# Patient Record
Sex: Female | Born: 1945 | Race: White | Hispanic: No | Smoking: Current every day smoker
Health system: Southern US, Community
[De-identification: ages and names within clinical notes are randomized; demographics above are authoritative.]

## PROBLEM LIST (undated history)

## (undated) DIAGNOSIS — F419 Anxiety disorder, unspecified: Secondary | ICD-10-CM

## (undated) DIAGNOSIS — C801 Malignant (primary) neoplasm, unspecified: Secondary | ICD-10-CM

## (undated) DIAGNOSIS — I2510 Atherosclerotic heart disease of native coronary artery without angina pectoris: Secondary | ICD-10-CM

## (undated) DIAGNOSIS — E7800 Pure hypercholesterolemia, unspecified: Secondary | ICD-10-CM

## (undated) DIAGNOSIS — R112 Nausea with vomiting, unspecified: Secondary | ICD-10-CM

## (undated) DIAGNOSIS — R51 Headache: Secondary | ICD-10-CM

## (undated) DIAGNOSIS — Z9889 Other specified postprocedural states: Secondary | ICD-10-CM

## (undated) DIAGNOSIS — M1990 Unspecified osteoarthritis, unspecified site: Secondary | ICD-10-CM

## (undated) DIAGNOSIS — R519 Headache, unspecified: Secondary | ICD-10-CM

## (undated) DIAGNOSIS — N2889 Other specified disorders of kidney and ureter: Secondary | ICD-10-CM

## (undated) DIAGNOSIS — R0600 Dyspnea, unspecified: Secondary | ICD-10-CM

## (undated) DIAGNOSIS — I10 Essential (primary) hypertension: Secondary | ICD-10-CM

## (undated) DIAGNOSIS — E119 Type 2 diabetes mellitus without complications: Secondary | ICD-10-CM

## (undated) DIAGNOSIS — I219 Acute myocardial infarction, unspecified: Secondary | ICD-10-CM

## (undated) HISTORY — PX: RENAL CRYOABLATION: SHX2322

## (undated) HISTORY — PX: EYE SURGERY: SHX253

## (undated) HISTORY — PX: CHOLECYSTECTOMY: SHX55

## (undated) HISTORY — PX: CARPAL TUNNEL RELEASE: SHX101

## (undated) HISTORY — PX: APPENDECTOMY: SHX54

## (undated) HISTORY — PX: CARDIAC CATHETERIZATION: SHX172

---

## 2015-09-04 DIAGNOSIS — R9431 Abnormal electrocardiogram [ECG] [EKG]: Secondary | ICD-10-CM | POA: Insufficient documentation

## 2015-09-25 DIAGNOSIS — E782 Mixed hyperlipidemia: Secondary | ICD-10-CM | POA: Insufficient documentation

## 2015-09-25 DIAGNOSIS — G5600 Carpal tunnel syndrome, unspecified upper limb: Secondary | ICD-10-CM | POA: Insufficient documentation

## 2015-09-25 DIAGNOSIS — I10 Essential (primary) hypertension: Secondary | ICD-10-CM | POA: Insufficient documentation

## 2015-09-25 DIAGNOSIS — G43909 Migraine, unspecified, not intractable, without status migrainosus: Secondary | ICD-10-CM | POA: Insufficient documentation

## 2015-11-25 DIAGNOSIS — M5136 Other intervertebral disc degeneration, lumbar region: Secondary | ICD-10-CM | POA: Insufficient documentation

## 2016-01-21 DIAGNOSIS — E785 Hyperlipidemia, unspecified: Secondary | ICD-10-CM | POA: Insufficient documentation

## 2016-01-21 DIAGNOSIS — J189 Pneumonia, unspecified organism: Secondary | ICD-10-CM | POA: Insufficient documentation

## 2016-01-21 DIAGNOSIS — E119 Type 2 diabetes mellitus without complications: Secondary | ICD-10-CM | POA: Insufficient documentation

## 2016-01-21 DIAGNOSIS — Z720 Tobacco use: Secondary | ICD-10-CM | POA: Insufficient documentation

## 2016-01-27 DIAGNOSIS — I5020 Unspecified systolic (congestive) heart failure: Secondary | ICD-10-CM | POA: Insufficient documentation

## 2016-01-27 DIAGNOSIS — F32A Depression, unspecified: Secondary | ICD-10-CM | POA: Insufficient documentation

## 2016-01-27 DIAGNOSIS — D649 Anemia, unspecified: Secondary | ICD-10-CM | POA: Insufficient documentation

## 2016-04-23 ENCOUNTER — Other Ambulatory Visit: Payer: Self-pay | Admitting: Adult Health Nurse Practitioner

## 2016-04-23 DIAGNOSIS — R4189 Other symptoms and signs involving cognitive functions and awareness: Secondary | ICD-10-CM

## 2016-04-23 DIAGNOSIS — R251 Tremor, unspecified: Secondary | ICD-10-CM

## 2016-04-23 DIAGNOSIS — G43809 Other migraine, not intractable, without status migrainosus: Secondary | ICD-10-CM

## 2016-05-05 ENCOUNTER — Ambulatory Visit
Admission: RE | Admit: 2016-05-05 | Discharge: 2016-05-05 | Disposition: A | Discharge status: Home / Self Care | Payer: Medicare Other | Source: Ambulatory Visit | Attending: Adult Health Nurse Practitioner | Admitting: Adult Health Nurse Practitioner

## 2016-05-05 DIAGNOSIS — R251 Tremor, unspecified: Secondary | ICD-10-CM

## 2016-05-05 DIAGNOSIS — G43809 Other migraine, not intractable, without status migrainosus: Secondary | ICD-10-CM

## 2016-05-05 DIAGNOSIS — R4189 Other symptoms and signs involving cognitive functions and awareness: Secondary | ICD-10-CM

## 2016-05-05 MED ORDER — GADOBENATE DIMEGLUMINE 529 MG/ML IV SOLN
13.0000 mL | Freq: Once | INTRAVENOUS | Status: AC | PRN
  Administered 2016-05-05: 13 mL via INTRAVENOUS

## 2016-05-09 HISTORY — PX: CORONARY ARTERY BYPASS GRAFT: SHX141

## 2016-05-14 DIAGNOSIS — I161 Hypertensive emergency: Secondary | ICD-10-CM | POA: Diagnosis not present

## 2016-05-14 DIAGNOSIS — I214 Non-ST elevation (NSTEMI) myocardial infarction: Secondary | ICD-10-CM | POA: Diagnosis not present

## 2016-05-14 DIAGNOSIS — N179 Acute kidney failure, unspecified: Secondary | ICD-10-CM

## 2016-05-14 DIAGNOSIS — E785 Hyperlipidemia, unspecified: Secondary | ICD-10-CM

## 2016-05-14 DIAGNOSIS — R112 Nausea with vomiting, unspecified: Secondary | ICD-10-CM

## 2016-05-14 DIAGNOSIS — F17200 Nicotine dependence, unspecified, uncomplicated: Secondary | ICD-10-CM

## 2016-05-14 DIAGNOSIS — F418 Other specified anxiety disorders: Secondary | ICD-10-CM

## 2016-05-14 DIAGNOSIS — E119 Type 2 diabetes mellitus without complications: Secondary | ICD-10-CM

## 2016-05-14 DIAGNOSIS — I2510 Atherosclerotic heart disease of native coronary artery without angina pectoris: Secondary | ICD-10-CM

## 2016-05-15 DIAGNOSIS — N179 Acute kidney failure, unspecified: Secondary | ICD-10-CM | POA: Diagnosis not present

## 2016-05-15 DIAGNOSIS — I161 Hypertensive emergency: Secondary | ICD-10-CM | POA: Diagnosis not present

## 2016-05-15 DIAGNOSIS — R112 Nausea with vomiting, unspecified: Secondary | ICD-10-CM | POA: Diagnosis not present

## 2016-05-15 DIAGNOSIS — I2510 Atherosclerotic heart disease of native coronary artery without angina pectoris: Secondary | ICD-10-CM | POA: Diagnosis not present

## 2016-05-16 DIAGNOSIS — R112 Nausea with vomiting, unspecified: Secondary | ICD-10-CM | POA: Diagnosis not present

## 2016-05-16 DIAGNOSIS — N179 Acute kidney failure, unspecified: Secondary | ICD-10-CM | POA: Diagnosis not present

## 2016-05-16 DIAGNOSIS — I161 Hypertensive emergency: Secondary | ICD-10-CM | POA: Diagnosis not present

## 2016-05-16 DIAGNOSIS — I214 Non-ST elevation (NSTEMI) myocardial infarction: Secondary | ICD-10-CM

## 2016-05-17 DIAGNOSIS — R112 Nausea with vomiting, unspecified: Secondary | ICD-10-CM | POA: Diagnosis not present

## 2016-05-17 DIAGNOSIS — I214 Non-ST elevation (NSTEMI) myocardial infarction: Secondary | ICD-10-CM | POA: Diagnosis not present

## 2016-05-17 DIAGNOSIS — I161 Hypertensive emergency: Secondary | ICD-10-CM | POA: Diagnosis not present

## 2016-05-17 DIAGNOSIS — N179 Acute kidney failure, unspecified: Secondary | ICD-10-CM | POA: Diagnosis not present

## 2016-06-02 DIAGNOSIS — F17200 Nicotine dependence, unspecified, uncomplicated: Secondary | ICD-10-CM

## 2016-06-02 DIAGNOSIS — F418 Other specified anxiety disorders: Secondary | ICD-10-CM | POA: Diagnosis not present

## 2016-06-02 DIAGNOSIS — E785 Hyperlipidemia, unspecified: Secondary | ICD-10-CM | POA: Diagnosis not present

## 2016-06-02 DIAGNOSIS — I2510 Atherosclerotic heart disease of native coronary artery without angina pectoris: Secondary | ICD-10-CM | POA: Diagnosis not present

## 2016-06-02 DIAGNOSIS — J90 Pleural effusion, not elsewhere classified: Secondary | ICD-10-CM | POA: Diagnosis not present

## 2016-06-03 DIAGNOSIS — E785 Hyperlipidemia, unspecified: Secondary | ICD-10-CM | POA: Diagnosis not present

## 2016-06-03 DIAGNOSIS — I2510 Atherosclerotic heart disease of native coronary artery without angina pectoris: Secondary | ICD-10-CM | POA: Diagnosis not present

## 2016-06-03 DIAGNOSIS — F418 Other specified anxiety disorders: Secondary | ICD-10-CM | POA: Diagnosis not present

## 2016-06-03 DIAGNOSIS — J90 Pleural effusion, not elsewhere classified: Secondary | ICD-10-CM | POA: Diagnosis not present

## 2016-06-07 DIAGNOSIS — Z951 Presence of aortocoronary bypass graft: Secondary | ICD-10-CM | POA: Insufficient documentation

## 2016-06-08 DIAGNOSIS — R112 Nausea with vomiting, unspecified: Secondary | ICD-10-CM | POA: Insufficient documentation

## 2016-06-08 DIAGNOSIS — J90 Pleural effusion, not elsewhere classified: Secondary | ICD-10-CM | POA: Insufficient documentation

## 2016-06-09 DIAGNOSIS — F4321 Adjustment disorder with depressed mood: Secondary | ICD-10-CM | POA: Insufficient documentation

## 2016-06-10 DIAGNOSIS — K2950 Unspecified chronic gastritis without bleeding: Secondary | ICD-10-CM | POA: Insufficient documentation

## 2016-06-10 DIAGNOSIS — J9811 Atelectasis: Secondary | ICD-10-CM | POA: Insufficient documentation

## 2016-07-13 DIAGNOSIS — F419 Anxiety disorder, unspecified: Secondary | ICD-10-CM | POA: Insufficient documentation

## 2016-09-01 ENCOUNTER — Other Ambulatory Visit: Payer: Self-pay | Admitting: *Deleted

## 2016-09-01 NOTE — Patient Outreach (Signed)
[facility]) Care Management  09/01/2016  Monique Sims 10-Oct-1945 [id number]   [facility] [facility]) Care Management Post-Acute Care Coordination  09/01/2016  Monique Sims Sep 05, 1945 [id number]   Met with [name], Discharge planner for [facility] and Rehab. Reviewed patient case. She confirms that patient family is seeking  [place] placement at another facility and there are no plans to discharge home at this time.Marland Kitchen  RNCM will sign off case.  Royetta Crochet. Laymond Purser, RN, BSN, [place] Post-Acute Care Coordinator [phone]

## 2016-09-23 DIAGNOSIS — R5381 Other malaise: Secondary | ICD-10-CM | POA: Insufficient documentation

## 2016-09-23 DIAGNOSIS — S82102D Unspecified fracture of upper end of left tibia, subsequent encounter for closed fracture with routine healing: Secondary | ICD-10-CM | POA: Insufficient documentation

## 2016-09-23 DIAGNOSIS — G9389 Other specified disorders of brain: Secondary | ICD-10-CM | POA: Insufficient documentation

## 2017-02-28 ENCOUNTER — Other Ambulatory Visit: Payer: Self-pay | Admitting: Urology

## 2017-02-28 DIAGNOSIS — N2889 Other specified disorders of kidney and ureter: Secondary | ICD-10-CM

## 2017-03-09 ENCOUNTER — Other Ambulatory Visit: Payer: Self-pay | Admitting: *Deleted

## 2017-03-09 ENCOUNTER — Ambulatory Visit
Admission: RE | Admit: 2017-03-09 | Discharge: 2017-03-09 | Disposition: A | Discharge status: Home / Self Care | Payer: Medicare Other | Source: Ambulatory Visit | Attending: Urology | Admitting: Urology

## 2017-03-09 DIAGNOSIS — N2889 Other specified disorders of kidney and ureter: Secondary | ICD-10-CM

## 2017-03-09 HISTORY — DX: Acute myocardial infarction, unspecified: I21.9

## 2017-03-09 HISTORY — DX: Other specified disorders of kidney and ureter: N28.89

## 2017-03-09 HISTORY — DX: Pure hypercholesterolemia, unspecified: E78.00

## 2017-03-09 HISTORY — DX: Type 2 diabetes mellitus without complications: E11.9

## 2017-03-09 HISTORY — PX: IR RADIOLOGIST EVAL & MGMT: IMG5224

## 2017-03-09 NOTE — Consult Note (Signed)
Chief Complaint: Patient was seen in consultation today for ablation of a left renal mass at the request of Chao,Roberto  Referring Physician(s): Chao,Roberto  History of Present Illness: Monique Sims is a 71 y.o. female who was found to have a solid enhancing mass of the left kidney by CT on 02/13/2016. This demonstrated an approximately 2.3 x 2.6 cm mass within the posterior mid left kidney without evidence of renal vein involvement or metastatic disease in the abdomen or pelvis. This was further followed by MRI of the abdomen on 06/23/2016 demonstrating a 2.2 x 2.7 cm diffusely enhancing solid mass most likely consistent with a carcinoma. Other benign cysts are present in both kidneys. The MRI showed no evidence of renal vein tumor, enlarged lymph nodes or metastatic disease in the abdomen. Monique Sims stated that renal ultrasound was performed in Dr. Ara Kussmaul office recently demonstrating no evidence of significant growth of the mass. She is currently asymptomatic with respect to the mass and has not had any hematuria. 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 Freda Munro and has been doing well since. Her cardiologist is Dr. Shirlee More.  She does continue to smoke but states that she is planning to quit smoking soon. She is not on oxygen or inhalers.  Past Medical History:  Diagnosis Date  . Diabetes mellitus without complication ([place])   . Hypercholesterolemia   . Left renal mass   . Myocardial infarction [facility])     Past Surgical History:  Procedure Laterality Date  . CORONARY ARTERY BYPASS GRAFT  05/2016    Allergies: Codeine; Dolobid [diflunisal]; Doxycycline; and Zofran [ondansetron hcl]  Medications: Prior to Admission medications   Medication Sig Start Date End Date Taking? Authorizing Provider  acetaminophen (TYLENOL) 650 MG CR tablet Take 650 mg by mouth every 8 (eight) hours as needed for pain.   Yes [provider]  ALPRAZolam Duanne Moron) 0.5 MG tablet Take 0.5 mg  by mouth 3 (three) times daily as needed for anxiety.   Yes [provider]  aspirin 81 MG tablet Take 81 mg by mouth daily.   Yes [provider]  carvedilol (COREG) 6.25 MG tablet Take 6.25 mg by mouth 2 (two) times daily with a meal.   Yes [provider]  fenofibrate 160 MG tablet Take 160 mg by mouth daily.   Yes [provider]  irbesartan-hydrochlorothiazide (AVALIDE) 150-12.5 MG tablet Take 1 tablet by mouth daily.   Yes [provider]  loratadine (CLARITIN) 10 MG tablet Take 10 mg by mouth daily as needed for allergies.   Yes [provider]  meloxicam (MOBIC) 7.5 MG tablet Take 7.5 mg by mouth 2 (two) times daily with a meal.   Yes [provider]  PARoxetine (PAXIL) 30 MG tablet Take 30 mg by mouth daily.   Yes [provider]  propranolol (INDERAL) 10 MG tablet Take 10 mg by mouth 3 (three) times daily.   Yes [provider]  sitaGLIPtin-metformin (JANUMET) 50-1000 MG tablet Take 1 tablet by mouth 2 (two) times daily with a meal.   Yes [provider]     No family history on file.  Social History   Social History  . Marital status: Married    Spouse name: N/A  . Number of children: N/A  . Years of education: N/A   Social History Main Topics  . Smoking status: Current Every Day Smoker    Packs/day: 0.50    Years: 50.00  Types: Cigarettes  . Smokeless tobacco: Never Used  . Alcohol use No  . Drug use: No  . Sexual activity: Not Asked   Other Topics Concern  . None   Social History Narrative  . None    Review of Systems: A 12 point ROS discussed and pertinent positives are indicated in the HPI above.  All other systems are negative.  Review of Systems  Constitutional: Negative.   HENT: Negative.   Respiratory: Negative.   Cardiovascular: Negative.   Gastrointestinal: Negative.   Genitourinary: Negative.   Musculoskeletal: Negative.   Neurological: Negative.      Vital Signs: BP 125/67   Pulse 69   Temp 98.5 F (36.9 C) (Oral)   Resp 14   Ht '5\' 3"'$  (1.6 m)   Wt 153 lb (69.4 kg)   SpO2 99%   BMI 27.10 kg/m   Physical Exam  Constitutional: She is oriented to person, place, and time. She appears well-developed and well-nourished. No distress.  HENT:  Head: Normocephalic and atraumatic.  Neck: Neck supple. No JVD present.  Cardiovascular: Normal rate, regular rhythm and normal heart sounds.  Exam reveals no gallop and no friction rub.   No murmur heard. Pulmonary/Chest: Effort normal. No stridor. No respiratory distress. She has no wheezes. She has no rales.  Slightly distant breath sounds bilaterally.  Abdominal: Soft. Bowel sounds are normal. She exhibits no distension and no mass. There is no tenderness. There is no rebound and no guarding.  Musculoskeletal: She exhibits no edema.  Lymphadenopathy:    She has no cervical adenopathy.  Neurological: She is alert and oriented to person, place, and time.  Skin: She is not diaphoretic.  Nursing note and vitals reviewed.   Imaging: No results found.  Labs: BUN 23, creatinine 0.83 and estimated GFR 72 mL/min on 07/22/2016.  Assessment and Plan:  I met with Monique Sims and her husband. We reviewed prior imaging of the left renal mass. 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 Lesion location and size is amenable to percutaneous ablation as a means of treatment. Given fairly recent CABG, a less invasive nephron sparing procedure would be safer than partial nephrectomy. Separate biopsy is not needed prior to ablation given nature of the tumor and will be performed at the time of ablation to establish a tissue diagnosis. Since she has not had any cross-sectional imaging since November of last year, I would like to obtain another MRI of the abdomen for planning purposes to make sure that the lesion has not grown significantly  and is of similar morphology. This will be scheduled as an outpatient at [facility].   We will begin the authorization and scheduling process for cryoablation of the left renal tumor at [facility]. I will also check with Dr. Bettina Gavia to see whether she needs any formal cardiac clearance prior to being placed under general anesthesia for the procedure. She states that she did have some trouble with her cognition during recovery from general anesthesia after CABG. I did tell her that the total amount of time under general anesthesia for an ablation procedure would certainly be less than for her heart surgery.  Thank you for this interesting consult.  I greatly enjoyed meeting Shandora Koogler and look forward to participating in their care.  A copy of this report was sent to the requesting provider on this date.  Electronically SignedAletta Edouard T 03/09/2017, 5:16 PM  I spent a total of 40 Minutes in face to face in clinical consultation, greater than 50% of which was counseling/coordinating care for treatment of a left renal mass.

## 2017-03-10 ENCOUNTER — Other Ambulatory Visit: Payer: Self-pay | Admitting: *Deleted

## 2017-03-10 DIAGNOSIS — N2889 Other specified disorders of kidney and ureter: Secondary | ICD-10-CM

## 2017-03-14 ENCOUNTER — Other Ambulatory Visit (HOSPITAL_COMMUNITY): Payer: Self-pay | Admitting: Interventional Radiology

## 2017-03-14 DIAGNOSIS — N2889 Other specified disorders of kidney and ureter: Secondary | ICD-10-CM

## 2017-04-13 ENCOUNTER — Other Ambulatory Visit: Payer: Self-pay | Admitting: Radiology

## 2017-04-14 NOTE — Patient Instructions (Signed)
Monique Sims  04/14/2017   Your procedure is scheduled on: 04/22/2017    [facility]  .  Go to Radiology first and check in then  Take East  elevators to 3rd floor to  [place] at  [place] AM.     Call this number if you have problems the morning of surgery [phone]    Remember: ONLY 1 PERSON MAY GO WITH YOU TO SHORT STAY TO GET  READY MORNING OF [place].  Do not eat food or drink liquids :After Midnight.     Take these medicines the morning of surgery with A SIP OF WATER: Xanax if needed, pepcid, Prozac, metoprolol ( Lopressor )  DO NOT TAKE ANY DIABETIC MEDICATIONS DAY OF YOUR SURGERY                               You may not have any metal on your body including hair pins and              piercings  Do not wear jewelry, make-up, lotions, powders or perfumes, deodorant             Do not wear nail polish.  Do not shave  48 hours prior to surgery.                 Do not bring valuables to the hospital. [place].  Contacts, dentures or bridgework may not be worn into surgery.  Leave suitcase in the car. After surgery it may be brought to your room.                     Please read over the following fact sheets you were given: _____________________________________________________________________             [facility] - Preparing for Surgery Before surgery, you can play an important role.  Because skin is not sterile, your skin needs to be as free of germs as possible.  You can reduce the number of germs on your skin by washing with CHG (chlorahexidine gluconate) soap before surgery.  CHG is an antiseptic cleaner which kills germs and bonds with the skin to continue killing germs even after washing. Please DO NOT use if you have an allergy to CHG or antibacterial soaps.  If your skin becomes reddened/irritated stop using the CHG and inform your nurse when you  arrive at Short Stay. Do not shave (including legs and underarms) for at least 48 hours prior to the first CHG shower.  You may shave your face/neck. Please follow these instructions carefully:  1.  Shower with CHG Soap the night before surgery and the  morning of Surgery.  2.  If you choose to wash your hair, wash your hair first as usual with your  normal  shampoo.  3.  After you shampoo, rinse your hair and body thoroughly to remove the  shampoo.                           4.  Use CHG as you would any other liquid soap.  You can apply chg directly  to the skin and wash  Gently with a scrungie or clean washcloth.  5.  Apply the CHG Soap to your body ONLY FROM THE NECK DOWN.   Do not use on face/ open                           Wound or open sores. Avoid contact with eyes, ears mouth and genitals (private parts).                       Wash face,  Genitals (private parts) with your normal soap.             6.  Wash thoroughly, paying special attention to the area where your surgery  will be performed.  7.  Thoroughly rinse your body with warm water from the neck down.  8.  DO NOT shower/wash with your normal soap after using and rinsing off  the CHG Soap.                9.  Pat yourself dry with a clean towel.            10.  Wear clean pajamas.            11.  Place clean sheets on your bed the night of your first shower and do not  sleep with pets. Day of Surgery : Do not apply any lotions/deodorants the morning of surgery.  Please wear clean clothes to the hospital/surgery center.  FAILURE TO FOLLOW THESE INSTRUCTIONS MAY RESULT IN THE CANCELLATION OF YOUR SURGERY PATIENT SIGNATURE_________________________________  NURSE SIGNATURE__________________________________  ________________________________________________________________________  WHAT IS A BLOOD TRANSFUSION? Blood Transfusion Information  A transfusion is the replacement of blood or some of its parts. Blood  is made up of multiple cells which provide different functions.  Red blood cells carry oxygen and are used for blood loss replacement.  White blood cells fight against infection.  Platelets control bleeding.  Plasma helps clot blood.  Other blood products are available for specialized needs, such as hemophilia or other clotting disorders. BEFORE THE TRANSFUSION  Who gives blood for transfusions?   Healthy volunteers who are fully evaluated to make sure their blood is safe. This is blood bank blood. Transfusion therapy is the safest it has ever been in the practice of medicine. Before blood is taken from a donor, a complete history is taken to make sure that person has no history of diseases nor engages in risky social behavior (examples are intravenous drug use or sexual activity with multiple partners). The donor's travel history is screened to minimize risk of transmitting infections, such as malaria. The donated blood is tested for signs of infectious diseases, such as HIV and hepatitis. The blood is then tested to be sure it is compatible with you in order to minimize the chance of a transfusion reaction. If you or a relative donates blood, this is often done in anticipation of surgery and is not appropriate for emergency situations. It takes many days to process the donated blood. RISKS AND COMPLICATIONS Although transfusion therapy is very safe and saves many lives, the main dangers of transfusion include:   Getting an infectious disease.  Developing a transfusion reaction. This is an allergic reaction to something in the blood you were given. Every precaution is taken to prevent this. The decision to have a blood transfusion has been considered carefully by your caregiver before blood is given. Blood is not given unless the benefits outweigh  the risks. AFTER THE TRANSFUSION  Right after receiving a blood transfusion, you will usually feel much better and more energetic. This is  especially true if your red blood cells have gotten low (anemic). The transfusion raises the level of the red blood cells which carry oxygen, and this usually causes an energy increase.  The nurse administering the transfusion will monitor you carefully for complications. HOME CARE INSTRUCTIONS  No special instructions are needed after a transfusion. You may find your energy is better. Speak with your caregiver about any limitations on activity for underlying diseases you may have. SEEK MEDICAL CARE IF:   Your condition is not improving after your transfusion.  You develop redness or irritation at the intravenous (IV) site. SEEK IMMEDIATE MEDICAL CARE IF:  Any of the following symptoms occur over the next 12 hours:  Shaking chills.  You have a temperature by mouth above 102 F (38.9 C), not controlled by medicine.  Chest, back, or muscle pain.  People around you feel you are not acting correctly or are confused.  Shortness of breath or difficulty breathing.  Dizziness and fainting.  You get a rash or develop hives.  You have a decrease in urine output.  Your urine turns a dark color or changes to pink, red, or brown. Any of the following symptoms occur over the next 10 days:  You have a temperature by mouth above 102 F (38.9 C), not controlled by medicine.  Shortness of breath.  Weakness after normal activity.  The white part of the eye turns yellow (jaundice).  You have a decrease in the amount of urine or are urinating less often.  Your urine turns a dark color or changes to pink, red, or brown. Document Released: 07/23/2000 Document Revised: 10/18/2011 Document Reviewed: 03/11/2008 [facility] Patient Information 2014 [place], [place].  _______________________________________________________________________

## 2017-04-15 ENCOUNTER — Encounter (HOSPITAL_COMMUNITY): Payer: Self-pay

## 2017-04-15 ENCOUNTER — Ambulatory Visit (HOSPITAL_COMMUNITY)
Admission: RE | Admit: 2017-04-15 | Discharge: 2017-04-15 | Disposition: A | Discharge status: Home / Self Care | Payer: Medicare Other | Source: Ambulatory Visit | Attending: Radiology | Admitting: Radiology

## 2017-04-15 ENCOUNTER — Encounter (HOSPITAL_COMMUNITY)
Admission: RE | Admit: 2017-04-15 | Discharge: 2017-04-15 | Disposition: A | Discharge status: Home / Self Care | Payer: Medicare Other | Source: Ambulatory Visit | Attending: Interventional Radiology | Admitting: Interventional Radiology

## 2017-04-15 DIAGNOSIS — R9431 Abnormal electrocardiogram [ECG] [EKG]: Secondary | ICD-10-CM | POA: Diagnosis not present

## 2017-04-15 DIAGNOSIS — R918 Other nonspecific abnormal finding of lung field: Secondary | ICD-10-CM | POA: Insufficient documentation

## 2017-04-15 DIAGNOSIS — N2889 Other specified disorders of kidney and ureter: Secondary | ICD-10-CM | POA: Insufficient documentation

## 2017-04-15 DIAGNOSIS — Z01818 Encounter for other preprocedural examination: Secondary | ICD-10-CM | POA: Diagnosis present

## 2017-04-15 DIAGNOSIS — E119 Type 2 diabetes mellitus without complications: Secondary | ICD-10-CM | POA: Insufficient documentation

## 2017-04-15 HISTORY — DX: Unspecified osteoarthritis, unspecified site: M19.90

## 2017-04-15 HISTORY — DX: Dyspnea, unspecified: R06.00

## 2017-04-15 HISTORY — DX: Other specified postprocedural states: R11.2

## 2017-04-15 HISTORY — DX: Other specified postprocedural states: Z98.890

## 2017-04-15 HISTORY — DX: Essential (primary) hypertension: I10

## 2017-04-15 HISTORY — DX: Headache, unspecified: R51.9

## 2017-04-15 HISTORY — DX: Headache: R51

## 2017-04-15 LAB — PROTIME-INR
INR: 0.92
PROTHROMBIN TIME: 12.3 s (ref 11.4–15.2)

## 2017-04-15 LAB — CBC WITH DIFFERENTIAL/PLATELET
BASOS PCT: 0 %
Basophils Absolute: 0 10*3/uL (ref 0.0–0.1)
EOS ABS: 0.2 10*3/uL (ref 0.0–0.7)
EOS PCT: 3 %
HCT: 33.1 % — ABNORMAL LOW (ref 36.0–46.0)
Hemoglobin: 11.3 g/dL — ABNORMAL LOW (ref 12.0–15.0)
LYMPHS ABS: 2.5 10*3/uL (ref 0.7–4.0)
Lymphocytes Relative: 32 %
MCH: 29 pg (ref 26.0–34.0)
MCHC: 34.1 g/dL (ref 30.0–36.0)
MCV: 85.1 fL (ref 78.0–100.0)
Monocytes Absolute: 0.4 10*3/uL (ref 0.1–1.0)
Monocytes Relative: 5 %
Neutro Abs: 4.7 10*3/uL (ref 1.7–7.7)
Neutrophils Relative %: 60 %
Platelets: 214 10*3/uL (ref 150–400)
RBC: 3.89 MIL/uL (ref 3.87–5.11)
RDW: 14.4 % (ref 11.5–15.5)
WBC: 7.8 10*3/uL (ref 4.0–10.5)

## 2017-04-15 LAB — HEMOGLOBIN A1C
HEMOGLOBIN A1C: 8.1 % — AB (ref 4.8–5.6)
Mean Plasma Glucose: 185.77 mg/dL

## 2017-04-15 LAB — BASIC METABOLIC PANEL
Anion gap: 8 (ref 5–15)
BUN: 22 mg/dL — AB (ref 6–20)
CALCIUM: 9.1 mg/dL (ref 8.9–10.3)
CHLORIDE: 102 mmol/L (ref 101–111)
CO2: 26 mmol/L (ref 22–32)
CREATININE: 0.8 mg/dL (ref 0.44–1.00)
Glucose, Bld: 189 mg/dL — ABNORMAL HIGH (ref 65–99)
Potassium: 4.9 mmol/L (ref 3.5–5.1)
SODIUM: 136 mmol/L (ref 135–145)

## 2017-04-15 LAB — GLUCOSE, CAPILLARY: GLUCOSE-CAPILLARY: 196 mg/dL — AB (ref 65–99)

## 2017-04-15 LAB — ABO/RH: ABO/RH(D): O POS

## 2017-04-15 NOTE — Progress Notes (Signed)
Ov note 07/23/16 Dr. Bea Graff on chart

## 2017-04-18 ENCOUNTER — Other Ambulatory Visit: Payer: Self-pay | Admitting: *Deleted

## 2017-04-18 DIAGNOSIS — R911 Solitary pulmonary nodule: Secondary | ICD-10-CM

## 2017-04-19 ENCOUNTER — Telehealth: Payer: Self-pay

## 2017-04-19 NOTE — Telephone Encounter (Signed)
Patient to have left renal cryoblation, [facility] requesting clearance. Records reviewed by Dr. Bettina Gavia and requested for physician performing surgery, Dr. Kathlene Cote, to call him and discuss the case. Dr. Joya Gaskins phone number given to Jocelyn Lamer at [employer] and will have Dr. Kathlene Cote call Dr. Bettina Gavia.

## 2017-04-19 NOTE — Telephone Encounter (Signed)
Patient advised that she does not need to keep her appointment that was made for Thursday afternoon for clearance. Dr. Bettina Gavia and Dr. Kathlene Cote spoke on the phone and Dr. Bettina Gavia gave okay for patient to be proceed with surgery without further testing. Patient will call to rescheduel 6 month f/u after surgery.

## 2017-04-20 ENCOUNTER — Other Ambulatory Visit: Payer: Self-pay | Admitting: General Surgery

## 2017-04-20 ENCOUNTER — Other Ambulatory Visit: Payer: Self-pay | Admitting: Radiology

## 2017-04-21 ENCOUNTER — Ambulatory Visit: Payer: Medicare Other | Admitting: Cardiology

## 2017-04-22 ENCOUNTER — Encounter (HOSPITAL_COMMUNITY): Admission: RE | Payer: Self-pay | Source: Ambulatory Visit

## 2017-04-22 ENCOUNTER — Ambulatory Visit (HOSPITAL_COMMUNITY)
Admission: RE | Admit: 2017-04-22 | Payer: Medicare Other | Source: Ambulatory Visit | Admitting: Interventional Radiology

## 2017-04-22 ENCOUNTER — Encounter (HOSPITAL_COMMUNITY): Payer: Self-pay

## 2017-04-22 ENCOUNTER — Ambulatory Visit (HOSPITAL_COMMUNITY)
Admission: RE | Admit: 2017-04-22 | Discharge: 2017-04-22 | Disposition: A | Discharge status: Home / Self Care | Payer: Medicare Other | Source: Ambulatory Visit | Attending: Interventional Radiology | Admitting: Interventional Radiology

## 2017-04-22 LAB — TYPE AND SCREEN
ABO/RH(D): O POS
Antibody Screen: NEGATIVE

## 2017-04-22 SURGERY — RADIO FREQUENCY ABLATION
Anesthesia: General | Laterality: Left

## 2017-05-03 ENCOUNTER — Other Ambulatory Visit: Payer: Self-pay | Admitting: Radiology

## 2017-05-04 NOTE — Progress Notes (Signed)
Patient called and given new instructions on date and time of surgery. Patient informed to report to Radiology at [facility] am on 05/06/2017 to check in. Take same medications that she was instructed to take at pre-op visit on 04/15/2017 for the morning of surgery - take Xanax if needed, Metoprolol, Prozac, Pepcid) and Nothing to eat or drink after midnight. Patient verbalized understanding.

## 2017-05-06 ENCOUNTER — Ambulatory Visit (HOSPITAL_COMMUNITY): Payer: Medicare Other

## 2017-05-23 ENCOUNTER — Encounter: Payer: Self-pay | Admitting: Interventional Radiology

## 2017-05-24 DIAGNOSIS — I2510 Atherosclerotic heart disease of native coronary artery without angina pectoris: Secondary | ICD-10-CM | POA: Insufficient documentation

## 2017-05-24 DIAGNOSIS — I252 Old myocardial infarction: Secondary | ICD-10-CM | POA: Insufficient documentation

## 2017-05-31 NOTE — Patient Instructions (Addendum)
Monique Sims  05/31/2017   Your procedure is scheduled on: 06/03/17  Report to [facility] Main  Entrance. Report to Radiology first then  Take East  elevators to 3rd floor to  [name] at    [place] AM.    Call this number if you have problems the morning of surgery [phone]    Remember: ONLY 1 PERSON MAY GO WITH YOU TO SHORT STAY TO GET  READY MORNING OF [name].  Do not eat food or drink liquids :After Midnight.     Take these medicines the morning of surgery with A SIP OF WATER: Inhaler as usual and bring, Xanax if needed, Pepcid, Prozac, metoprolol ( Lopressor) DO NOT TAKE ANY DIABETIC MEDICATIONS DAY OF YOUR SURGERY                               You may not have any metal on your body including hair pins and              piercings  Do not wear jewelry, make-up, lotions, powders or perfumes, deodorant             Do not wear nail polish.  Do not shave  48 hours prior to surgery.     Do not bring valuables to the hospital. [place].  Contacts, dentures or bridgework may not be worn into surgery.  Leave suitcase in the car. After surgery it may be brought to your room.                       Please read over the following fact sheets you were given: _____________________________________________________________________             [facility] - Preparing for Surgery Before surgery, you can play an important role.  Because skin is not sterile, your skin needs to be as free of germs as possible.  You can reduce the number of germs on your skin by washing with CHG (chlorahexidine gluconate) soap before surgery.  CHG is an antiseptic cleaner which kills germs and bonds with the skin to continue killing germs even after washing. Please DO NOT use if you have an allergy to CHG or antibacterial soaps.  If your skin becomes reddened/irritated stop using the CHG and inform your nurse when  you arrive at Short Stay. Do not shave (including legs and underarms) for at least 48 hours prior to the first CHG shower.  You may shave your face/neck. Please follow these instructions carefully:  1.  Shower with CHG Soap the night before surgery and the  morning of Surgery.  2.  If you choose to wash your hair, wash your hair first as usual with your  normal  shampoo.  3.  After you shampoo, rinse your hair and body thoroughly to remove the  shampoo.                           4.  Use CHG as you would any other liquid soap.  You can apply chg directly  to the skin and wash  Gently with a scrungie or clean washcloth.  5.  Apply the CHG Soap to your body ONLY FROM THE NECK DOWN.   Do not use on face/ open                           Wound or open sores. Avoid contact with eyes, ears mouth and genitals (private parts).                       Wash face,  Genitals (private parts) with your normal soap.             6.  Wash thoroughly, paying special attention to the area where your surgery  will be performed.  7.  Thoroughly rinse your body with warm water from the neck down.  8.  DO NOT shower/wash with your normal soap after using and rinsing off  the CHG Soap.                9.  Pat yourself dry with a clean towel.            10.  Wear clean pajamas.            11.  Place clean sheets on your bed the night of your first shower and do not  sleep with pets. Day of Surgery : Do not apply any lotions/deodorants the morning of surgery.  Please wear clean clothes to the hospital/surgery center.  FAILURE TO FOLLOW THESE INSTRUCTIONS MAY RESULT IN THE CANCELLATION OF YOUR SURGERY PATIENT SIGNATURE_________________________________  NURSE SIGNATURE__________________________________  ________________________________________________________________________

## 2017-06-01 ENCOUNTER — Other Ambulatory Visit: Payer: Self-pay | Admitting: Radiology

## 2017-06-01 ENCOUNTER — Encounter (HOSPITAL_COMMUNITY)
Admission: RE | Admit: 2017-06-01 | Discharge: 2017-06-01 | Disposition: A | Discharge status: Home / Self Care | Payer: Medicare Other | Source: Ambulatory Visit | Attending: Interventional Radiology | Admitting: Interventional Radiology

## 2017-06-01 ENCOUNTER — Encounter (HOSPITAL_COMMUNITY): Payer: Self-pay

## 2017-06-01 ENCOUNTER — Other Ambulatory Visit: Payer: Self-pay | Admitting: General Surgery

## 2017-06-01 DIAGNOSIS — I252 Old myocardial infarction: Secondary | ICD-10-CM | POA: Diagnosis not present

## 2017-06-01 DIAGNOSIS — I10 Essential (primary) hypertension: Secondary | ICD-10-CM | POA: Diagnosis not present

## 2017-06-01 DIAGNOSIS — E119 Type 2 diabetes mellitus without complications: Secondary | ICD-10-CM | POA: Diagnosis not present

## 2017-06-01 DIAGNOSIS — E7800 Pure hypercholesterolemia, unspecified: Secondary | ICD-10-CM | POA: Diagnosis not present

## 2017-06-01 DIAGNOSIS — Z951 Presence of aortocoronary bypass graft: Secondary | ICD-10-CM | POA: Diagnosis not present

## 2017-06-01 DIAGNOSIS — Z7984 Long term (current) use of oral hypoglycemic drugs: Secondary | ICD-10-CM | POA: Diagnosis not present

## 2017-06-01 DIAGNOSIS — I2510 Atherosclerotic heart disease of native coronary artery without angina pectoris: Secondary | ICD-10-CM | POA: Diagnosis not present

## 2017-06-01 DIAGNOSIS — Z7982 Long term (current) use of aspirin: Secondary | ICD-10-CM | POA: Diagnosis not present

## 2017-06-01 DIAGNOSIS — C642 Malignant neoplasm of left kidney, except renal pelvis: Secondary | ICD-10-CM | POA: Diagnosis present

## 2017-06-01 DIAGNOSIS — F17210 Nicotine dependence, cigarettes, uncomplicated: Secondary | ICD-10-CM | POA: Diagnosis not present

## 2017-06-01 DIAGNOSIS — F419 Anxiety disorder, unspecified: Secondary | ICD-10-CM | POA: Diagnosis not present

## 2017-06-01 DIAGNOSIS — Z79899 Other long term (current) drug therapy: Secondary | ICD-10-CM | POA: Diagnosis not present

## 2017-06-01 HISTORY — DX: Anxiety disorder, unspecified: F41.9

## 2017-06-01 LAB — CBC WITH DIFFERENTIAL/PLATELET
Basophils Absolute: 0 K/uL (ref 0.0–0.1)
Basophils Relative: 0 %
Eosinophils Absolute: 0.2 K/uL (ref 0.0–0.7)
Eosinophils Relative: 2 %
HCT: 36 % (ref 36.0–46.0)
Hemoglobin: 12 g/dL (ref 12.0–15.0)
Lymphocytes Relative: 27 %
Lymphs Abs: 3.2 K/uL (ref 0.7–4.0)
MCH: 29.6 pg (ref 26.0–34.0)
MCHC: 33.3 g/dL (ref 30.0–36.0)
MCV: 88.9 fL (ref 78.0–100.0)
Monocytes Absolute: 0.8 K/uL (ref 0.1–1.0)
Monocytes Relative: 7 %
Neutro Abs: 7.7 K/uL (ref 1.7–7.7)
Neutrophils Relative %: 64 %
Platelets: 264 K/uL (ref 150–400)
RBC: 4.05 MIL/uL (ref 3.87–5.11)
RDW: 14.2 % (ref 11.5–15.5)
WBC: 11.9 K/uL — ABNORMAL HIGH (ref 4.0–10.5)

## 2017-06-01 LAB — COMPREHENSIVE METABOLIC PANEL WITH GFR
ALT: 18 U/L (ref 14–54)
AST: 15 U/L (ref 15–41)
Albumin: 3.4 g/dL — ABNORMAL LOW (ref 3.5–5.0)
Alkaline Phosphatase: 89 U/L (ref 38–126)
Anion gap: 10 (ref 5–15)
BUN: 15 mg/dL (ref 6–20)
CO2: 27 mmol/L (ref 22–32)
Calcium: 9 mg/dL (ref 8.9–10.3)
Chloride: 98 mmol/L — ABNORMAL LOW (ref 101–111)
Creatinine, Ser: 0.75 mg/dL (ref 0.44–1.00)
GFR calc Af Amer: >60 mL/min (ref >60)
GFR calc non Af Amer: >60 mL/min (ref >60)
Glucose, Bld: 240 mg/dL — ABNORMAL HIGH (ref 65–99)
Potassium: 5 mmol/L (ref 3.5–5.1)
Sodium: 135 mmol/L (ref 135–145)
Total Bilirubin: 0.4 mg/dL (ref 0.3–1.2)
Total Protein: 7 g/dL (ref 6.5–8.1)

## 2017-06-01 LAB — GLUCOSE, CAPILLARY: Glucose-Capillary: 299 mg/dL — ABNORMAL HIGH (ref 65–99)

## 2017-06-01 NOTE — Progress Notes (Signed)
CBC and CMP done 06/01/17 faxed via epic to [facility].

## 2017-06-01 NOTE — Progress Notes (Signed)
Patient instruction sheet for Radiology was left with Kieth Brightly in lab for nurse to give to patient at preop.  In reviewing the preop instruction sheet nurse called Tiffany listed as contact on bottom of instruction sheet and informed Tiffany that nurse in preop could not go over preop instructions per Radiology .  Since nurse works in [place] could only give preop instructions per anesthesia.  Tiffany asked me to give instructions ( piece of paper) to patient and have patient call her with any questions.  Nurse called Asst director, Colon Branch and informed Terri that instruction sheet had different diabetic instructions and also contained blood thinner instructions.  Nurse informed Karna Christmas that I was going to call Tiffany in Radiology back and tell her that if she wanted patient to have instruction sheet she would need to give to patient and go over with her and I would call her at end of preop appt and she could come see patient.  Tiffany stated that if I did not want to give to patient nurse should have stated so and nurse could take paper and place in shredder.  Nurse informed Jonelle [place] that is what would happen and I did have on the instruction sheet for her to arrive at 0630am.

## 2017-06-01 NOTE — Progress Notes (Addendum)
04/15/17-ekg-epic Clearance-04/19/17-in telephone note in epic related to Dr Shirlee More- cardiologist  LOV-07/13/16-  [facility] on chart - Patient states she sees Dr Unk Lightning as of 2018- [place]- in epic.  CXR-1v-04/15/17-epic  10/18/16-LOV- cardiology - epic  hgba1c-04/15/17-epic

## 2017-06-02 ENCOUNTER — Other Ambulatory Visit: Payer: Self-pay | Admitting: Radiology

## 2017-06-02 ENCOUNTER — Other Ambulatory Visit: Payer: Self-pay | Admitting: General Surgery

## 2017-06-02 NOTE — Anesthesia Preprocedure Evaluation (Addendum)
Anesthesia Evaluation  Patient identified by MRN, date of birth, ID band Patient awake    Reviewed: Allergy & Precautions, NPO status , Patient's Chart, lab work & pertinent test results, reviewed documented beta blocker date and time   Airway Mallampati: II  TM Distance: >3 FB Neck ROM: Full    Dental  (+) Lower Dentures, Upper Dentures   Pulmonary Current Smoker,    Pulmonary exam normal breath sounds clear to auscultation       Cardiovascular Exercise Tolerance: Good hypertension, + Past MI and + CABG  Normal cardiovascular exam Rhythm:Regular Rate:Normal  TTE 05/2016 - EF 55-65%, trace MR, possible chiari network vs redundant eustachian valve noted in RA   Neuro/Psych  Headaches, Anxiety    GI/Hepatic negative GI ROS, Neg liver ROS,   Endo/Other  diabetes, Poorly Controlled  Renal/GU Left renal mass  negative genitourinary   Musculoskeletal  (+) Arthritis ,   Abdominal   Peds  Hematology negative hematology ROS (+)   Anesthesia Other Findings   Reproductive/Obstetrics                            Anesthesia Physical Anesthesia Plan  ASA: III  Anesthesia Plan: General   Post-op Pain Management:    Induction: Intravenous  PONV Risk Score and Plan: 3 and Dexamethasone, Scopolamine patch - Pre-op and Treatment may vary due to age or medical condition  Airway Management Planned: Oral ETT  Additional Equipment: None  Intra-op Plan:   Post-operative Plan: Extubation in OR  Informed Consent: I have reviewed the patients History and Physical, chart, labs and discussed the procedure including the risks, benefits and alternatives for the proposed anesthesia with the patient or authorized representative who has indicated his/her understanding and acceptance.   Dental advisory given  Plan Discussed with: CRNA  Anesthesia Plan Comments:       Anesthesia Quick Evaluation

## 2017-06-03 ENCOUNTER — Ambulatory Visit (HOSPITAL_COMMUNITY): Payer: Medicare Other

## 2017-06-03 ENCOUNTER — Ambulatory Visit (HOSPITAL_COMMUNITY): Payer: Medicare Other | Admitting: Anesthesiology

## 2017-06-03 ENCOUNTER — Ambulatory Visit (HOSPITAL_COMMUNITY): Admission: RE | Admit: 2017-06-03 | Payer: Medicare Other | Source: Ambulatory Visit

## 2017-06-03 ENCOUNTER — Encounter (HOSPITAL_COMMUNITY): Payer: Self-pay

## 2017-06-03 ENCOUNTER — Encounter (HOSPITAL_COMMUNITY)
Admission: RE | Disposition: A | Discharge status: Home / Self Care | Payer: Self-pay | Source: Ambulatory Visit | Attending: Interventional Radiology

## 2017-06-03 ENCOUNTER — Observation Stay (HOSPITAL_COMMUNITY)
Admission: RE | Admit: 2017-06-03 | Discharge: 2017-06-04 | Disposition: A | Discharge status: Home / Self Care | Payer: Medicare Other | Source: Ambulatory Visit | Attending: Interventional Radiology | Admitting: Interventional Radiology

## 2017-06-03 ENCOUNTER — Encounter (HOSPITAL_COMMUNITY): Payer: Self-pay | Admitting: *Deleted

## 2017-06-03 ENCOUNTER — Ambulatory Visit (HOSPITAL_COMMUNITY)
Admission: RE | Admit: 2017-06-03 | Discharge: 2017-06-03 | Disposition: A | Discharge status: Home / Self Care | Payer: Medicare Other | Source: Ambulatory Visit | Attending: Interventional Radiology | Admitting: Interventional Radiology

## 2017-06-03 DIAGNOSIS — E119 Type 2 diabetes mellitus without complications: Secondary | ICD-10-CM | POA: Insufficient documentation

## 2017-06-03 DIAGNOSIS — E7800 Pure hypercholesterolemia, unspecified: Secondary | ICD-10-CM | POA: Insufficient documentation

## 2017-06-03 DIAGNOSIS — R059 Cough, unspecified: Secondary | ICD-10-CM

## 2017-06-03 DIAGNOSIS — I252 Old myocardial infarction: Secondary | ICD-10-CM | POA: Insufficient documentation

## 2017-06-03 DIAGNOSIS — Z7982 Long term (current) use of aspirin: Secondary | ICD-10-CM | POA: Insufficient documentation

## 2017-06-03 DIAGNOSIS — N2889 Other specified disorders of kidney and ureter: Secondary | ICD-10-CM | POA: Diagnosis present

## 2017-06-03 DIAGNOSIS — C642 Malignant neoplasm of left kidney, except renal pelvis: Principal | ICD-10-CM | POA: Insufficient documentation

## 2017-06-03 DIAGNOSIS — Z79899 Other long term (current) drug therapy: Secondary | ICD-10-CM | POA: Insufficient documentation

## 2017-06-03 DIAGNOSIS — I10 Essential (primary) hypertension: Secondary | ICD-10-CM | POA: Insufficient documentation

## 2017-06-03 DIAGNOSIS — I2510 Atherosclerotic heart disease of native coronary artery without angina pectoris: Secondary | ICD-10-CM | POA: Insufficient documentation

## 2017-06-03 DIAGNOSIS — Z951 Presence of aortocoronary bypass graft: Secondary | ICD-10-CM | POA: Insufficient documentation

## 2017-06-03 DIAGNOSIS — F419 Anxiety disorder, unspecified: Secondary | ICD-10-CM | POA: Insufficient documentation

## 2017-06-03 DIAGNOSIS — Z7984 Long term (current) use of oral hypoglycemic drugs: Secondary | ICD-10-CM | POA: Insufficient documentation

## 2017-06-03 DIAGNOSIS — F17210 Nicotine dependence, cigarettes, uncomplicated: Secondary | ICD-10-CM | POA: Insufficient documentation

## 2017-06-03 DIAGNOSIS — R05 Cough: Secondary | ICD-10-CM

## 2017-06-03 HISTORY — DX: Atherosclerotic heart disease of native coronary artery without angina pectoris: I25.10

## 2017-06-03 HISTORY — PX: RADIOLOGY WITH ANESTHESIA: SHX6223

## 2017-06-03 LAB — GLUCOSE, CAPILLARY
GLUCOSE-CAPILLARY: 318 mg/dL — AB (ref 65–99)
GLUCOSE-CAPILLARY: 201 mg/dL — AB (ref 65–99)
Glucose-Capillary: 309 mg/dL — ABNORMAL HIGH (ref 65–99)
Glucose-Capillary: 207 mg/dL — ABNORMAL HIGH (ref 65–99)
Glucose-Capillary: 332 mg/dL — ABNORMAL HIGH (ref 65–99)
Glucose-Capillary: 407 mg/dL — ABNORMAL HIGH (ref 65–99)

## 2017-06-03 LAB — TYPE AND SCREEN
ABO/RH(D): O POS
ANTIBODY SCREEN: NEGATIVE

## 2017-06-03 SURGERY — RADIOLOGY WITH ANESTHESIA
Anesthesia: General | Laterality: Left

## 2017-06-03 MED ORDER — INSULIN ASPART 100 UNIT/ML ~~LOC~~ SOLN
SUBCUTANEOUS | Status: AC
  Filled 2017-06-03: qty 1

## 2017-06-03 MED ORDER — FENTANYL CITRATE (PF) 100 MCG/2ML IJ SOLN: 25.0000 ug | INTRAMUSCULAR | Status: DC | PRN

## 2017-06-03 MED ORDER — EPHEDRINE SULFATE-NACL 50-0.9 MG/10ML-% IV SOSY
PREFILLED_SYRINGE | INTRAVENOUS | Status: DC | PRN
  Administered 2017-06-03: 10 mg via INTRAVENOUS

## 2017-06-03 MED ORDER — SUGAMMADEX SODIUM 200 MG/2ML IV SOLN
INTRAVENOUS | Status: DC | PRN
  Administered 2017-06-03: 200 mg via INTRAVENOUS

## 2017-06-03 MED ORDER — FENTANYL CITRATE (PF) 100 MCG/2ML IJ SOLN
INTRAMUSCULAR | Status: AC
  Filled 2017-06-03: qty 4

## 2017-06-03 MED ORDER — IOPAMIDOL (ISOVUE-370) INJECTION 76%
100.0000 mL | Freq: Once | INTRAVENOUS | Status: AC | PRN
  Administered 2017-06-03: 75 mL via INTRAVENOUS

## 2017-06-03 MED ORDER — INSULIN ASPART 100 UNIT/ML ~~LOC~~ SOLN
SUBCUTANEOUS | Status: DC | PRN
  Administered 2017-06-03: 5 [IU] via INTRAVENOUS

## 2017-06-03 MED ORDER — SCOPOLAMINE 1 MG/3DAYS TD PT72
MEDICATED_PATCH | TRANSDERMAL | Status: AC
  Administered 2017-06-03: 1.5 mg via TRANSDERMAL
  Filled 2017-06-03: qty 1

## 2017-06-03 MED ORDER — HYDROMORPHONE HCL 1 MG/ML IJ SOLN
1.0000 mg | INTRAMUSCULAR | Status: DC | PRN
  Administered 2017-06-03 (×2): 1 mg via INTRAVENOUS
  Filled 2017-06-03 (×2): qty 1

## 2017-06-03 MED ORDER — SCOPOLAMINE 1 MG/3DAYS TD PT72
1.0000 | MEDICATED_PATCH | Freq: Once | TRANSDERMAL | Status: DC
  Administered 2017-06-03: 1.5 mg via TRANSDERMAL

## 2017-06-03 MED ORDER — DOCUSATE SODIUM 100 MG PO CAPS
100.0000 mg | ORAL_CAPSULE | Freq: Two times a day (BID) | ORAL | Status: DC
  Administered 2017-06-03 – 2017-06-04 (×2): 100 mg via ORAL
  Filled 2017-06-03 (×3): qty 1

## 2017-06-03 MED ORDER — SENNOSIDES-DOCUSATE SODIUM 8.6-50 MG PO TABS
1.0000 | ORAL_TABLET | Freq: Every day | ORAL | Status: DC | PRN
  Filled 2017-06-03: qty 1

## 2017-06-03 MED ORDER — FENTANYL CITRATE (PF) 100 MCG/2ML IJ SOLN
INTRAMUSCULAR | Status: DC | PRN
  Administered 2017-06-03 (×2): 50 ug via INTRAVENOUS

## 2017-06-03 MED ORDER — DEXAMETHASONE SODIUM PHOSPHATE 10 MG/ML IJ SOLN
INTRAMUSCULAR | Status: DC | PRN
  Administered 2017-06-03: 10 mg via INTRAVENOUS

## 2017-06-03 MED ORDER — PROMETHAZINE HCL 25 MG/ML IJ SOLN: 12.5000 mg | Freq: Four times a day (QID) | INTRAMUSCULAR | Status: DC | PRN

## 2017-06-03 MED ORDER — PROPOFOL 10 MG/ML IV BOLUS
INTRAVENOUS | Status: DC | PRN
  Administered 2017-06-03: 150 mg via INTRAVENOUS

## 2017-06-03 MED ORDER — INSULIN ASPART 100 UNIT/ML ~~LOC~~ SOLN
0.0000 [IU] | Freq: Three times a day (TID) | SUBCUTANEOUS | Status: DC
  Administered 2017-06-04: 11 [IU] via SUBCUTANEOUS

## 2017-06-03 MED ORDER — PHENYLEPHRINE 40 MCG/ML (10ML) SYRINGE FOR IV PUSH (FOR BLOOD PRESSURE SUPPORT)
PREFILLED_SYRINGE | INTRAVENOUS | Status: DC | PRN
  Administered 2017-06-03 (×3): 80 ug via INTRAVENOUS
  Administered 2017-06-03: 40 ug via INTRAVENOUS
  Administered 2017-06-03: 120 ug via INTRAVENOUS

## 2017-06-03 MED ORDER — IOPAMIDOL (ISOVUE-370) INJECTION 76%
INTRAVENOUS | Status: AC
  Filled 2017-06-03: qty 100

## 2017-06-03 MED ORDER — DEXTROSE 5 % IV SOLN
INTRAVENOUS | Status: DC | PRN
  Administered 2017-06-03: 25 ug/min via INTRAVENOUS

## 2017-06-03 MED ORDER — ROCURONIUM BROMIDE 10 MG/ML (PF) SYRINGE
PREFILLED_SYRINGE | INTRAVENOUS | Status: DC | PRN
  Administered 2017-06-03: 10 mg via INTRAVENOUS
  Administered 2017-06-03: 50 mg via INTRAVENOUS

## 2017-06-03 MED ORDER — LIDOCAINE 2% (20 MG/ML) 5 ML SYRINGE
INTRAMUSCULAR | Status: DC | PRN
  Administered 2017-06-03: 100 mg via INTRAVENOUS

## 2017-06-03 MED ORDER — LACTATED RINGERS IV SOLN
INTRAVENOUS | Status: DC
  Administered 2017-06-03 (×2): via INTRAVENOUS

## 2017-06-03 MED ORDER — CEFAZOLIN SODIUM-DEXTROSE 2-4 GM/100ML-% IV SOLN
2.0000 g | INTRAVENOUS | Status: AC
  Administered 2017-06-03: 2 g via INTRAVENOUS
  Filled 2017-06-03: qty 100

## 2017-06-03 NOTE — Anesthesia Procedure Notes (Signed)
Procedure Name: Intubation Date/Time: 06/03/2017 9:24 AM Performed by: Lind Covert Pre-anesthesia Checklist: Patient identified, Emergency Drugs available, Suction available, Patient being monitored and Timeout performed Patient Re-evaluated:Patient Re-evaluated prior to induction Oxygen Delivery Method: Circle system utilized Preoxygenation: Pre-oxygenation with 100% oxygen Induction Type: IV induction Ventilation: Mask ventilation without difficulty Laryngoscope Size: Mac and 4 Grade View: Grade I Tube size: 7.0 mm Number of attempts: 1 Airway Equipment and Method: Stylet Placement Confirmation: ETT inserted through vocal cords under direct vision,  positive ETCO2 and breath sounds checked- equal and bilateral Secured at: 19 cm Tube secured with: Tape Dental Injury: Teeth and Oropharynx as per pre-operative assessment

## 2017-06-03 NOTE — Progress Notes (Signed)
Doing well post procedure No specific complaints.  BP (!) 134/51 (BP Location: Left Arm)   Pulse 78   Temp 98.3 F (36.8 C) (Oral)   Resp 18   SpO2 93%  Abd: soft, NT (L) flank puncture site dressing c/d/i, no hematoma Good UOP, clear yellow  S/p (L)renal mass bx/cryoablation Foley out later Anticipate DC in am.  Ascencion Dike PA-C Interventional Radiology 06/03/2017 3:53 PM

## 2017-06-03 NOTE — H&P (Signed)
Chief Complaint: (L)renal mass  Referring Physician(s): Aletta Edouard  Supervising Physician: Aletta Edouard  Patient Status: [facility] - Out-pt  History of Present Illness: Monique Sims is a 71 y.o. female seen by Dr. Kathlene Cote for treatment of a left renal mass presumed to be cancerous. After completion of her workup, she is scheduled for CT guided cryoablation of left renal mass. PMHx, meds, labs, imaging reviewed. She does report some cough secondary to sinus drainage, but denies fevers, chills, CP, SOB. Has been NPO this am. Did not take any home meds. Son at bedside.   Past Medical History:  Diagnosis Date  . Anxiety   . Arthritis    mild  . Coronary artery disease   . Diabetes mellitus without complication ([place])    type 2  . Dyspnea   . Headache    hx of migraines  . Hypercholesterolemia   . Hypertension   . Left renal mass   . Myocardial infarction ([place])   . PONV (postoperative nausea and vomiting)     Past Surgical History:  Procedure Laterality Date  . APPENDECTOMY    . CARDIAC CATHETERIZATION    . CARPAL TUNNEL RELEASE    . CHOLECYSTECTOMY    . CORONARY ARTERY BYPASS GRAFT  05/2016  . EYE SURGERY     bil cataracts  . IR RADIOLOGIST EVAL & MGMT  03/09/2017  . RENAL CRYOABLATION      Allergies: Codeine; Dolobid [diflunisal]; Doxycycline; Levofloxacin; and Zofran [ondansetron hcl]  Medications: Prior to Admission medications   Medication Sig Start Date End Date Taking? Authorizing Provider  acetaminophen (TYLENOL) 500 MG tablet Take 1,000 mg by mouth every 6 (six) hours as needed (for pain.).    [provider]  albuterol (PROVENTIL HFA;VENTOLIN HFA) 108 (90 Base) MCG/ACT inhaler Inhale 1-2 puffs into the lungs every 6 (six) hours as needed for wheezing or shortness of breath.    [provider]  ALPRAZolam Duanne Moron) 0.5 MG tablet Take 0.25-0.5 mg by mouth 3 (three) times daily as needed for anxiety.     [provider]    aspirin EC 81 MG tablet Take 81 mg by mouth once a week.    [provider]  cefdinir (OMNICEF) 300 MG capsule Take 300 mg by mouth 2 (two) times daily. 05/24/17 06/03/17  [provider]  ezetimibe (ZETIA) 10 MG tablet Take 10 mg by mouth daily. 02/02/17   [provider]  famotidine (PEPCID) 20 MG tablet Take 20 mg by mouth 2 (two) times daily. 02/12/17   [provider]  FLUoxetine (PROZAC) 20 MG capsule Take 20 mg by mouth daily. 02/26/17   [provider]  metoprolol tartrate (LOPRESSOR) 25 MG tablet Take 25 mg by mouth 2 (two) times daily. 01/21/17   [provider]  mirtazapine (REMERON) 15 MG tablet Take 15 mg by mouth at bedtime. 02/02/17   [provider]  pravastatin (PRAVACHOL) 40 MG tablet Take 40 mg by mouth at bedtime. 02/26/17   [provider]  promethazine (PHENERGAN) 12.5 MG tablet Take 12.5 mg by mouth every 6 (six) hours as needed for nausea or vomiting.    [provider]  sitaGLIPtin-metformin (JANUMET) 50-1000 MG tablet Take 1 tablet by mouth 2 (two) times daily with a meal.    [provider]     No family history on file.  Social History   Social History  . Marital status: Widowed    Spouse name: N/A  . Number of children:  N/A  . Years of education: N/A   Social History Main Topics  . Smoking status: Current Every Day Smoker    Packs/day: 0.50    Years: 50.00    Types: Cigarettes  . Smokeless tobacco: Never Used  . Alcohol use No  . Drug use: No  . Sexual activity: Not Currently   Other Topics Concern  . Not on file   Social History Narrative  . No narrative on file     Review of Systems: A 12 point ROS discussed and pertinent positives are indicated in the HPI above.  All other systems are negative.  Review of Systems  Vital Signs: There were no vitals taken for this visit.  Physical Exam  Constitutional: She is oriented to person, place, and time. She  appears well-developed. No distress.  HENT:  Head: Normocephalic.  Mouth/Throat: Oropharynx is clear and moist.  Neck: Normal range of motion. No JVD present. No tracheal deviation present.  Cardiovascular: Normal rate, regular rhythm and normal heart sounds.   Pulmonary/Chest: Effort normal and breath sounds normal. No respiratory distress.  Abdominal: Soft. She exhibits no mass. There is no tenderness.  Neurological: She is alert and oriented to person, place, and time.  Skin: Skin is warm and dry.  Psychiatric: She has a normal mood and affect.    Imaging: Dg Chest Port 1 View  Result Date: 06/03/2017 CLINICAL DATA:  Preoperative examination. Patient for cryoablation of a lesion in the left kidney. EXAM: PORTABLE CHEST 1 VIEW COMPARISON:  Single-view of the chest 04/15/2017. FINDINGS: Lungs are clear. Heart size is upper normal. No pneumothorax or pleural effusion. The patient is status post CABG. No acute bony abnormality. IMPRESSION: No acute disease. Electronically Signed   By: Inge Rise M.D.   On: 06/03/2017 07:51    Labs:  CBC:  Recent Labs  04/15/17 1149 06/01/17 1350  WBC 7.8 11.9*  HGB 11.3* 12.0  HCT 33.1* 36.0  PLT 214 264    COAGS:  Recent Labs  04/15/17 1149  INR 0.92    BMP:  Recent Labs  04/15/17 1149 06/01/17 1350  NA 136 135  K 4.9 5.0  CL 102 98*  CO2 26 27  GLUCOSE 189* 240*  BUN 22* 15  CALCIUM 9.1 9.0  CREATININE 0.80 0.75  GFRNONAA >60 >60  GFRAA >60 >60    LIVER FUNCTION TESTS:  Recent Labs  06/01/17 1350  BILITOT 0.4  AST 15  ALT 18  ALKPHOS 89  PROT 7.0  ALBUMIN 3.4*    TUMOR MARKERS: No results for input(s): AFPTM, CEA, CA199, CHROMGRNA in the last 8760 hours.  Assessment and Plan: (L)renal mass. For CT guided cryoablation Labs, CXR reviewed. No acute infectious process. Risks and benefits discussed with the patient including, but not limited to failure to treat entire lesion, bleeding, infection,  damage to adjacent structures, decrease in renal function or post procedural neuropathy. All of the patient's questions were answered, patient is agreeable to proceed. Plan for overnight observation. Consent signed and in chart.    Thank you for this interesting consult.  I greatly enjoyed meeting Monique Sims and look forward to participating in their care.  A copy of this report was sent to the requesting provider on this date.  Electronically Signed: Ascencion Dike, PA-C 06/03/2017, 8:33 AM   I spent a total of 20 minutes in face to face in clinical consultation, greater than 50% of which was counseling/coordinating care for left renal mass cryoablation

## 2017-06-03 NOTE — H&P (Deleted)
  The note originally documented on this encounter has been moved the the encounter in which it belongs.  

## 2017-06-03 NOTE — Transfer of Care (Signed)
Immediate Anesthesia Transfer of Care Note  Patient: Monique Sims  Procedure(s) Performed: RADIOLOGY WITH ANESTHESIA-RENAL CRYOABLATION (Left )  Patient Location: PACU  Anesthesia Type:General  Level of Consciousness: sedated  Airway & Oxygen Therapy: Patient Spontanous Breathing and Patient connected to face mask oxygen  Post-op Assessment: Report given to RN and Post -op Vital signs reviewed and stable  Post vital signs: Reviewed and stable  Last Vitals: There were no vitals filed for this visit.  Last Pain: There were no vitals filed for this visit.       Complications: No apparent anesthesia complications

## 2017-06-03 NOTE — Anesthesia Postprocedure Evaluation (Signed)
Anesthesia Post Note  Patient: Monique Sims  Procedure(s) Performed: RADIOLOGY WITH ANESTHESIA-RENAL CRYOABLATION (Left )     Patient location during evaluation: PACU Anesthesia Type: General Level of consciousness: awake and alert Pain management: pain level controlled Vital Signs Assessment: post-procedure vital signs reviewed and stable Respiratory status: spontaneous breathing, nonlabored ventilation and respiratory function stable Cardiovascular status: blood pressure returned to baseline and stable Postop Assessment: no apparent nausea or vomiting Anesthetic complications: no    Last Vitals:  Vitals:   06/03/17 1245 06/03/17 1315  BP: (!) 156/61   Pulse: 77   Resp: (!) 22   Temp:  36.9 C  SpO2: 100%     Last Pain:  Vitals:   06/03/17 1315  PainSc: 0-No pain                 Audry Pili

## 2017-06-03 NOTE — Sedation Documentation (Signed)
Anesthesia in to sedate and monitor patient 

## 2017-06-03 NOTE — Procedures (Signed)
Interventional Radiology Procedure Note  Procedure: CT guided biopsy and cryoablation of left renal mass  Anesthesia: General  Complications: None  Estimated Blood Loss: < 10 mL  Left renal mass biopsy, 18 G core x 1 via 17 G needle.  Cryoablation performed with 3 Galil Ice Rod CX probes.  Plan:  Overnight observation  Eulas Post T. Kathlene Cote, M.D Pager:  [phone]

## 2017-06-04 DIAGNOSIS — C642 Malignant neoplasm of left kidney, except renal pelvis: Secondary | ICD-10-CM | POA: Diagnosis not present

## 2017-06-04 LAB — CBC
HCT: 29.6 % — ABNORMAL LOW (ref 36.0–46.0)
Hemoglobin: 9.8 g/dL — ABNORMAL LOW (ref 12.0–15.0)
MCH: 29.1 pg (ref 26.0–34.0)
MCHC: 33.1 g/dL (ref 30.0–36.0)
MCV: 87.8 fL (ref 78.0–100.0)
PLATELETS: 197 10*3/uL (ref 150–400)
RBC: 3.37 MIL/uL — ABNORMAL LOW (ref 3.87–5.11)
RDW: 14.1 % (ref 11.5–15.5)
WBC: 11 10*3/uL — AB (ref 4.0–10.5)

## 2017-06-04 LAB — BASIC METABOLIC PANEL
ANION GAP: 9 (ref 5–15)
BUN: 24 mg/dL — AB (ref 6–20)
CALCIUM: 8 mg/dL — AB (ref 8.9–10.3)
CO2: 24 mmol/L (ref 22–32)
Chloride: 99 mmol/L — ABNORMAL LOW (ref 101–111)
Creatinine, Ser: 0.92 mg/dL (ref 0.44–1.00)
GFR calc Af Amer: >60 mL/min (ref >60)
GFR calc non Af Amer: >60 mL/min (ref >60)
GLUCOSE: 357 mg/dL — AB (ref 65–99)
Potassium: 5.1 mmol/L (ref 3.5–5.1)
SODIUM: 132 mmol/L — AB (ref 135–145)

## 2017-06-04 NOTE — Discharge Summary (Signed)
Patient ID: Monique Sims MRN: [id number] DOB/AGE: 1946-03-15 71 y.o.  Admit date: 06/03/2017 Discharge date: 06/04/2017  Supervising Physician: Aletta Edouard  Patient Status: [facility] - In-pt  Admission Diagnoses: Left renal mass  Discharge Diagnoses:  Active Problems:   Left renal mass   Discharged Condition: stable  Hospital Course:  Left renal mass; presumed cancer. Cryoablation and biopsy 10/26 in IR with Dr Kathlene Cote. Procedure was without complication. Overnight stay for observation was without event. Denies pain; denies N/V- eating well. Urine is clear yellow I hane seen and examined pt---discussed status with Dr Vernard Gambles Plan for discharge home now.   Consults: None  Significant Diagnostic Studies: CT guided biopsy and cryoablation of left renal mass  Treatments: Cryoablation of left renal mass 06/03/17 in IR  Discharge Exam: Blood pressure (!) 128/58, pulse 64, temperature 98.2 F (36.8 C), temperature source Oral, resp. rate 16, SpO2 94 %.   In NAD A/O VSS Pleasant No complaints Heart: RRR Lungs: few wheezes Abd: soft; +BS; NT Extr: FROM Site of left renal ablation: clean and dry; NT; no bleeding or hematoma Good UOP- clear yellow  Results for orders placed or performed during the hospital encounter of 06/03/17  Glucose, capillary  Result Value Ref Range   Glucose-Capillary 309 (H) 65 - 99 mg/dL  Glucose, capillary  Result Value Ref Range   Glucose-Capillary 201 (H) 65 - 99 mg/dL  Glucose, capillary  Result Value Ref Range   Glucose-Capillary 318 (H) 65 - 99 mg/dL   Comment 1 Notify RN    Comment 2 Document in Chart   Glucose, capillary  Result Value Ref Range   Glucose-Capillary 207 (H) 65 - 99 mg/dL   Comment 1 Notify RN    Comment 2 Document in Chart   Basic metabolic panel  Result Value Ref Range   Sodium 132 (L) 135 - 145 mmol/L   Potassium 5.1 3.5 - 5.1 mmol/L   Chloride 99 (L) 101 - 111 mmol/L   CO2 24 22 - 32 mmol/L   Glucose, Bld 357 (H) 65 - 99 mg/dL   BUN 24 (H) 6 - 20 mg/dL   Creatinine, Ser 0.92 0.44 - 1.00 mg/dL   Calcium 8.0 (L) 8.9 - 10.3 mg/dL   GFR calc non Af Amer >60 >60 mL/min   GFR calc Af Amer >60 >60 mL/min   Anion gap 9 5 - 15  CBC  Result Value Ref Range   WBC 11.0 (H) 4.0 - 10.5 K/uL   RBC 3.37 (L) 3.87 - 5.11 MIL/uL   Hemoglobin 9.8 (L) 12.0 - 15.0 g/dL   HCT 29.6 (L) 36.0 - 46.0 %   MCV 87.8 78.0 - 100.0 fL   MCH 29.1 26.0 - 34.0 pg   MCHC 33.1 30.0 - 36.0 g/dL   RDW 14.1 11.5 - 15.5 %   Platelets 197 150 - 400 K/uL  Glucose, capillary  Result Value Ref Range   Glucose-Capillary 332 (H) 65 - 99 mg/dL   Comment 1 Notify RN    Comment 2 Document in Chart   Glucose, capillary  Result Value Ref Range   Glucose-Capillary 407 (H) 65 - 99 mg/dL   Comment 1 Notify RN     Disposition: Left renal mass- presumed cancer Biopsy performed and pending Overnight stay uneventful. Plan for discharge home now Continue all home meds Pt has good understanding of all instructions 4 week follow up with Dr Kathlene Cote   Discharge Instructions    Call MD for:  difficulty breathing, headache or visual disturbances    Complete by:  As directed    Call MD for:  extreme fatigue    Complete by:  As directed    Call MD for:  hives    Complete by:  As directed    Call MD for:  persistant dizziness or light-headedness    Complete by:  As directed    Call MD for:  persistant nausea and vomiting    Complete by:  As directed    Call MD for:  redness, tenderness, or signs of infection (pain, swelling, redness, odor or green/yellow discharge around incision site)    Complete by:  As directed    Call MD for:  severe uncontrolled pain    Complete by:  As directed    Call MD for:  temperature >100.4    Complete by:  As directed    Diet - low sodium heart healthy    Complete by:  As directed    Discharge instructions    Complete by:  As directed    Restful over weekend; push fluids; resume all  home meds; pt will hear from scheduler for follow up appt date and time   Discharge wound care:    Complete by:  As directed    Keep clean bandaid on site daily for 1 week   Driving Restrictions    Complete by:  As directed    No driving for 3 days   Increase activity slowly    Complete by:  As directed    Lifting restrictions    Complete by:  As directed    No lifting over 10 lbs x 1 week     Allergies as of 06/04/2017      Reactions   Codeine Nausea Only   Dolobid [diflunisal] Other (See Comments)   Severe headache   Doxycycline Nausea And Vomiting   Levofloxacin Nausea And Vomiting   Zofran [ondansetron Hcl] Nausea Only      Medication List    STOP taking these medications   benzonatate 200 MG capsule Commonly known as:  TESSALON   cefdinir 300 MG capsule Commonly known as:  OMNICEF     TAKE these medications   acetaminophen 500 MG tablet Commonly known as:  TYLENOL Take 1,000 mg by mouth every 6 (six) hours as needed (for pain.).   albuterol 108 (90 Base) MCG/ACT inhaler Commonly known as:  PROVENTIL HFA;VENTOLIN HFA Inhale 1-2 puffs into the lungs every 6 (six) hours as needed for wheezing or shortness of breath.   ALPRAZolam 0.5 MG tablet Commonly known as:  XANAX Take 0.25-0.5 mg by mouth 3 (three) times daily as needed for anxiety.   aspirin EC 81 MG tablet Take 81 mg by mouth once a week.   ezetimibe 10 MG tablet Commonly known as:  ZETIA Take 10 mg by mouth daily.   famotidine 20 MG tablet Commonly known as:  PEPCID Take 20 mg by mouth 2 (two) times daily.   FLUoxetine 20 MG capsule Commonly known as:  PROZAC Take 20 mg by mouth daily.   metoprolol tartrate 25 MG tablet Commonly known as:  LOPRESSOR Take 25 mg by mouth 2 (two) times daily.   mirtazapine 15 MG tablet Commonly known as:  REMERON Take 15 mg by mouth at bedtime.   pravastatin 40 MG tablet Commonly known as:  PRAVACHOL Take 40 mg by mouth at bedtime.   promethazine 12.5  MG tablet Commonly known as:  PHENERGAN Take  12.5 mg by mouth every 6 (six) hours as needed for nausea or vomiting.   sitaGLIPtin-metformin 50-1000 MG tablet Commonly known as:  JANUMET Take 1 tablet by mouth 2 (two) times daily with a meal.            Discharge Care Instructions        Start     Ordered   06/04/17 0000  Discharge wound care:    Comments:  Keep clean bandaid on site daily for 1 week   06/04/17 1114     Follow-up Information    Aletta Edouard, MD Follow up in 4 week(s).   Specialty:  Interventional Radiology Why:  follow up appt with Dr Kathlene Cote ----scheduler will call prt with time and date. call [phone] if questions or concerns Contact information: [address] [place] [address] [phone]            Electronically Signed: Monia Sabal A, PA-C 06/04/2017, 11:16 AM   I have spent Greater Than 30 Minutes discharging Monique Sims.

## 2017-06-04 NOTE — Progress Notes (Signed)
Discharge teaching completed with teach back. Discharge handout given and reviewed with pt. and son. Band-Aid applied to lower back. Pt. Understands scheduler to call for follow up appointment, but will call herself if no one calls. Pt. discharged to home, left via wheelchair. No respiratory distress noted.

## 2017-06-06 LAB — GLUCOSE, CAPILLARY: GLUCOSE-CAPILLARY: 350 mg/dL — AB (ref 65–99)

## 2017-06-07 ENCOUNTER — Other Ambulatory Visit: Payer: Self-pay | Admitting: *Deleted

## 2017-06-07 DIAGNOSIS — N2889 Other specified disorders of kidney and ureter: Secondary | ICD-10-CM

## 2017-07-07 ENCOUNTER — Ambulatory Visit
Admission: RE | Admit: 2017-07-07 | Discharge: 2017-07-07 | Disposition: A | Discharge status: Home / Self Care | Payer: Medicare Other | Source: Ambulatory Visit | Attending: Radiology | Admitting: Radiology

## 2017-07-07 ENCOUNTER — Encounter: Payer: Self-pay | Admitting: *Deleted

## 2017-07-07 DIAGNOSIS — N2889 Other specified disorders of kidney and ureter: Secondary | ICD-10-CM

## 2017-07-07 HISTORY — PX: IR RADIOLOGIST EVAL & MGMT: IMG5224

## 2017-07-07 NOTE — Progress Notes (Signed)
Chief Complaint: Status post cryoablation of a 3 cm left renal carcinoma on 06/03/2017.  History of Present Illness: Monique Sims is a 71 y.o. female status post cryoablation of a left renal carcinoma on 06/03/2017. Biopsy of the mass at the time of the procedure demonstrated clear cell carcinoma, nuclear grade 2. She has done well after the procedure and is currently asymptomatic. She has no urinary symptoms. She denies fever.  Past Medical History:  Diagnosis Date  . Anxiety   . Arthritis    mild  . Coronary artery disease   . Diabetes mellitus without complication ([place])    type 2  . Dyspnea   . Headache    hx of migraines  . Hypercholesterolemia   . Hypertension   . Left renal mass   . Myocardial infarction ([place])   . PONV (postoperative nausea and vomiting)    pt states during her heart surgery was very mean when she awaken from anesthesia     Past Surgical History:  Procedure Laterality Date  . APPENDECTOMY    . CARDIAC CATHETERIZATION    . CARPAL TUNNEL RELEASE    . CHOLECYSTECTOMY    . CORONARY ARTERY BYPASS GRAFT  05/2016  . EYE SURGERY     bil cataracts  . IR RADIOLOGIST EVAL & MGMT  03/09/2017  . IR RADIOLOGIST EVAL & MGMT  07/07/2017  . RADIOLOGY WITH ANESTHESIA Left 06/03/2017   Procedure: RADIOLOGY WITH ANESTHESIA-RENAL CRYOABLATION;  Surgeon: Aletta Edouard, MD;  Location: [facility] ORS;  Service: Radiology;  Laterality: Left;  . RENAL CRYOABLATION      Allergies: Codeine; Dolobid [diflunisal]; Doxycycline; Levofloxacin; and Zofran [ondansetron hcl]  Medications: Prior to Admission medications   Medication Sig Start Date End Date Taking? Authorizing Provider  acetaminophen (TYLENOL) 500 MG tablet Take 1,000 mg by mouth every 6 (six) hours as needed (for pain.).    [provider]  albuterol (PROVENTIL HFA;VENTOLIN HFA) 108 (90 Base) MCG/ACT inhaler Inhale 1-2 puffs into the lungs every 6 (six) hours as needed for wheezing or shortness of  breath.    [provider]  ALPRAZolam Duanne Moron) 0.5 MG tablet Take 0.25-0.5 mg by mouth 3 (three) times daily as needed for anxiety.     [provider]  aspirin EC 81 MG tablet Take 81 mg by mouth once a week.    [provider]  ezetimibe (ZETIA) 10 MG tablet Take 10 mg by mouth daily. 02/02/17   [provider]  famotidine (PEPCID) 20 MG tablet Take 20 mg by mouth 2 (two) times daily. 02/12/17   [provider]  FLUoxetine (PROZAC) 20 MG capsule Take 20 mg by mouth daily. 02/26/17   [provider]  metoprolol tartrate (LOPRESSOR) 25 MG tablet Take 25 mg by mouth 2 (two) times daily. 01/21/17   [provider]  mirtazapine (REMERON) 15 MG tablet Take 15 mg by mouth at bedtime. 02/02/17   [provider]  pravastatin (PRAVACHOL) 40 MG tablet Take 40 mg by mouth at bedtime. 02/26/17   [provider]  promethazine (PHENERGAN) 12.5 MG tablet Take 12.5 mg by mouth every 6 (six) hours as needed for nausea or vomiting.    [provider]  sitaGLIPtin-metformin (JANUMET) 50-1000 MG tablet Take 1 tablet by mouth 2 (two) times daily with a meal.    [provider]     No family history on file.  Social History   Socioeconomic History  . Marital status: Widowed  Spouse name: Not on file  . Number of children: Not on file  . Years of education: Not on file  . Highest education level: Not on file  Social Needs  . Financial resource strain: Not on file  . Food insecurity - worry: Not on file  . Food insecurity - inability: Not on file  . Transportation needs - medical: Not on file  . Transportation needs - non-medical: Not on file  Occupational History  . Not on file  Tobacco Use  . Smoking status: Current Every Day Smoker    Packs/day: 0.50    Years: 50.00    Pack years: 25.00    Types: Cigarettes  . Smokeless tobacco: Never Used  Substance and Sexual Activity  . Alcohol use: No  . Drug use: No   . Sexual activity: Not Currently  Other Topics Concern  . Not on file  Social History Narrative  . Not on file    ECOG Status: 0 - Asymptomatic  Review of Systems: A 12 point ROS discussed and pertinent positives are indicated in the HPI above.  All other systems are negative.  Review of Systems  Constitutional: Negative.   Respiratory: Negative.   Cardiovascular: Negative.   Gastrointestinal: Negative.   Genitourinary: Negative.   Musculoskeletal: Negative.   Neurological: Negative.     Vital Signs: BP (!) 168/77   Pulse 70   Temp 98 F (36.7 C) (Oral)   Resp 15   Ht '5\' 2"'$  (1.575 m)   Wt 155 lb (70.3 kg)   SpO2 98%   BMI 28.35 kg/m   Physical Exam  Constitutional: She is oriented to person, place, and time. She appears well-developed and well-nourished. No distress.  Abdominal: Soft. She exhibits no distension and no mass. There is no tenderness. There is no rebound and no guarding.  Musculoskeletal: She exhibits no edema.  Neurological: She is alert and oriented to person, place, and time.  Skin: She is not diaphoretic.  Vitals reviewed.    Imaging: Ir Radiologist Eval & Mgmt  Result Date: 07/07/2017 Please refer to notes tab for details about interventional procedure. (Op Note)   Labs:  CBC: Recent Labs    04/15/17 1149 06/01/17 1350 06/04/17 0340  WBC 7.8 11.9* 11.0*  HGB 11.3* 12.0 9.8*  HCT 33.1* 36.0 29.6*  PLT 214 264 197    COAGS: Recent Labs    04/15/17 1149  INR 0.92    BMP: Recent Labs    04/15/17 1149 06/01/17 1350 06/04/17 0340  NA 136 135 132*  K 4.9 5.0 5.1  CL 102 98* 99*  CO2 '26 27 24  '$ GLUCOSE 189* 240* 357*  BUN 22* 15 24*  CALCIUM 9.1 9.0 8.0*  CREATININE 0.80 0.75 0.92  GFRNONAA >60 >60 >60  GFRAA >60 >60 >60   BUN 13, creatinine 0.7 and GFR greater than 60 mL per minute on 07/04/2017  LIVER FUNCTION TESTS: Recent Labs    06/01/17 1350  BILITOT 0.4  AST 15  ALT 18  ALKPHOS 89  PROT 7.0  ALBUMIN  3.4*    Assessment and Plan:  I met with Monique Sims and her daughter-in-law. She is doing well post ablation and shows no evidence of complication. Check of renal function on 07/04/2017 shows stable and normal renal function. I recommended we perform a follow-up CT of the abdomen with and without contrast in late January, 3 months post ablation. We will coordinate the scan with an office visit to review imaging  findings at that time.   Electronically SignedAletta Edouard T 07/07/2017, 2:09 PM   I spent a total of 15 Minutes in face to face in clinical consultation, greater than 50% of which was counseling/coordinating care post cryoablation of a left renal carcinoma.

## 2017-07-12 DIAGNOSIS — K219 Gastro-esophageal reflux disease without esophagitis: Secondary | ICD-10-CM | POA: Insufficient documentation

## 2017-07-12 DIAGNOSIS — Z1231 Encounter for screening mammogram for malignant neoplasm of breast: Secondary | ICD-10-CM | POA: Insufficient documentation

## 2017-08-24 ENCOUNTER — Other Ambulatory Visit (HOSPITAL_COMMUNITY): Payer: Self-pay | Admitting: Interventional Radiology

## 2017-08-24 ENCOUNTER — Encounter: Payer: Self-pay | Admitting: Radiology

## 2017-08-24 ENCOUNTER — Other Ambulatory Visit: Payer: Self-pay | Admitting: Radiology

## 2017-08-24 DIAGNOSIS — C642 Malignant neoplasm of left kidney, except renal pelvis: Secondary | ICD-10-CM

## 2017-09-07 ENCOUNTER — Other Ambulatory Visit: Payer: Medicare Other

## 2017-09-07 ENCOUNTER — Ambulatory Visit (HOSPITAL_COMMUNITY): Payer: Medicare Other

## 2017-09-07 ENCOUNTER — Other Ambulatory Visit (HOSPITAL_COMMUNITY): Payer: Medicare Other

## 2017-09-22 ENCOUNTER — Ambulatory Visit
Admission: RE | Admit: 2017-09-22 | Discharge: 2017-09-22 | Disposition: A | Discharge status: Home / Self Care | Payer: Medicare Other | Source: Ambulatory Visit | Attending: Interventional Radiology | Admitting: Interventional Radiology

## 2017-09-22 ENCOUNTER — Encounter: Payer: Self-pay | Admitting: Radiology

## 2017-09-22 ENCOUNTER — Ambulatory Visit (HOSPITAL_COMMUNITY)
Admission: RE | Admit: 2017-09-22 | Discharge: 2017-09-22 | Disposition: A | Discharge status: Home / Self Care | Payer: Medicare Other | Source: Ambulatory Visit | Attending: Interventional Radiology | Admitting: Interventional Radiology

## 2017-09-22 DIAGNOSIS — I2510 Atherosclerotic heart disease of native coronary artery without angina pectoris: Secondary | ICD-10-CM | POA: Insufficient documentation

## 2017-09-22 DIAGNOSIS — C642 Malignant neoplasm of left kidney, except renal pelvis: Secondary | ICD-10-CM

## 2017-09-22 DIAGNOSIS — I700 Atherosclerosis of aorta: Secondary | ICD-10-CM | POA: Diagnosis not present

## 2017-09-22 DIAGNOSIS — K449 Diaphragmatic hernia without obstruction or gangrene: Secondary | ICD-10-CM | POA: Insufficient documentation

## 2017-09-22 HISTORY — PX: IR RADIOLOGIST EVAL & MGMT: IMG5224

## 2017-09-22 LAB — POCT I-STAT CREATININE: Creatinine, Ser: 0.7 mg/dL (ref 0.44–1.00)

## 2017-09-22 MED ORDER — IOPAMIDOL (ISOVUE-300) INJECTION 61%
INTRAVENOUS | Status: AC
  Filled 2017-09-22: qty 100

## 2017-09-22 MED ORDER — IOPAMIDOL (ISOVUE-300) INJECTION 61%
100.0000 mL | Freq: Once | INTRAVENOUS | Status: AC | PRN
  Administered 2017-09-22: 100 mL via INTRAVENOUS

## 2017-09-22 NOTE — Progress Notes (Signed)
Chief Complaint: Patient was seen in consultation today for  Chief Complaint  Patient presents with  . 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 female who was found to have a solid enhancing mass of the left kidney by CT on 02/13/2016.   This demonstrated an approximately 2.3 x 2.6 cm mass within the posterior mid left kidney without evidence of renal vein involvement or metastatic disease in the abdomen or pelvis.   This was further followed by MRI of the abdomen on 06/23/2016 demonstrating a 2.2 x 2.7 cm diffusely enhancing solid mass most likely consistent with a carcinoma. Other benign cysts are present in both kidneys.   The MRI showed no evidence of renal vein tumor, enlarged lymph nodes or metastatic disease in the abdomen.   She was seen in consultation by Dr. Kathlene Cote on 03/09/2017 for evaluation for cryoablation.  Mrs. Koons stated that renal ultrasound was performed in Dr. Ara Kussmaul office recently demonstrating no evidence of significant growth of the mass.   At the time of initial consult, she was asymptomatic with respect to the mass and had not had any hematuria.   She was also status post CABG October of 2017 by Dr. Freda Munro and had been doing well since. Her cardiologist is Dr. Shirlee More.  Given her cardiac history, it was felt that cryoablation would be best.  She underwent a biopsy followed by cryoablation by Dr. Kathlene Cote on 06/03/2017.  Biopsy of the mass at the time of the procedure demonstrated clear cell carcinoma, nuclear grade 2.  She is here today for her 3 month post procedure follow up.  She is doing well. She has no complaints.   Past Medical History:  Diagnosis Date  . Anxiety   . Arthritis    mild  . Coronary artery disease   . Diabetes mellitus without  complication ([place])    type 2  . Dyspnea   . Headache    hx of migraines  . Hypercholesterolemia   . Hypertension   . Left renal mass   . Myocardial infarction ([place])   . PONV (postoperative nausea and vomiting)    pt states during her heart surgery was very mean when she awaken from anesthesia     Past Surgical History:  Procedure Laterality Date  . APPENDECTOMY    . CARDIAC CATHETERIZATION    . CARPAL TUNNEL RELEASE    . CHOLECYSTECTOMY    . CORONARY ARTERY BYPASS GRAFT  05/2016  . EYE SURGERY     bil cataracts  . IR RADIOLOGIST EVAL & MGMT  03/09/2017  . IR RADIOLOGIST EVAL & MGMT  07/07/2017  . RADIOLOGY WITH ANESTHESIA Left 06/03/2017   Procedure: RADIOLOGY WITH ANESTHESIA-RENAL CRYOABLATION;  Surgeon: Aletta Edouard, MD;  Location: [facility];  Service: Radiology;  Laterality: Left;  . RENAL CRYOABLATION      Allergies: Codeine; Dolobid [diflunisal]; Doxycycline; Levofloxacin; and Zofran [ondansetron hcl]  Medications: Prior to Admission medications   Medication Sig Start Date End Date Taking? Authorizing Provider  acetaminophen (TYLENOL) 500 MG tablet Take 1,000 mg by mouth every 6 (six) hours as needed (for pain.).    [provider]  albuterol (PROVENTIL HFA;VENTOLIN HFA) 108 (90 Base) MCG/ACT inhaler Inhale 1-2 puffs into the lungs every 6 (six) hours as needed for wheezing or shortness of breath.  [provider]  ALPRAZolam Duanne Moron) 0.5 MG tablet Take 0.25-0.5 mg by mouth 3 (three) times daily as needed for anxiety.     [provider]  aspirin EC 81 MG tablet Take 81 mg by mouth once a week.    [provider]  ezetimibe (ZETIA) 10 MG tablet Take 10 mg by mouth daily. 02/02/17   [provider]  famotidine (PEPCID) 20 MG tablet Take 20 mg by mouth 2 (two) times daily. 02/12/17   [provider]  FLUoxetine (PROZAC) 20 MG capsule Take 20 mg by mouth daily. 02/26/17   [provider]  metoprolol tartrate  (LOPRESSOR) 25 MG tablet Take 25 mg by mouth 2 (two) times daily. 01/21/17   [provider]  mirtazapine (REMERON) 15 MG tablet Take 15 mg by mouth at bedtime. 02/02/17   [provider]  pravastatin (PRAVACHOL) 40 MG tablet Take 40 mg by mouth at bedtime. 02/26/17   [provider]  promethazine (PHENERGAN) 12.5 MG tablet Take 12.5 mg by mouth every 6 (six) hours as needed for nausea or vomiting.    [provider]  sitaGLIPtin-metformin (JANUMET) 50-1000 MG tablet Take 1 tablet by mouth 2 (two) times daily with a meal.    [provider]     No family history on file.  Social History   Socioeconomic History  . Marital status: Widowed    Spouse name: Not on file  . Number of children: Not on file  . Years of education: Not on file  . Highest education level: Not on file  Social Needs  . Financial resource strain: Not on file  . Food insecurity - worry: Not on file  . Food insecurity - inability: Not on file  . Transportation needs - medical: Not on file  . Transportation needs - non-medical: Not on file  Occupational History  . Not on file  Tobacco Use  . Smoking status: Current Every Day Smoker    Packs/day: 0.50    Years: 50.00    Pack years: 25.00    Types: Cigarettes  . Smokeless tobacco: Never Used  Substance and Sexual Activity  . Alcohol use: No  . Drug use: No  . Sexual activity: Not Currently  Other Topics Concern  . Not on file  Social History Narrative  . Not on file   Review of Systems: A 12 point ROS discussed and pertinent positives are indicated in the HPI above.  All other systems are negative. Review of Systems  Vital Signs: BP (!) 179/90   Pulse 73   Temp 98.6 F (37 C) (Oral)   Resp 16   Ht 5\' 3"  (1.6 m)   Wt 155 lb (70.3 kg)   SpO2 99%   BMI 27.46 kg/m   Physical Exam  Constitutional: She is oriented to person, place, and time. She appears well-developed.  HENT:  Head: Normocephalic and  atraumatic.  Eyes: EOM are normal.  Neck: Normal range of motion.  Cardiovascular: Normal rate, regular rhythm and normal heart sounds.  Pulmonary/Chest: Effort normal and breath sounds normal.  Abdominal: Soft. She exhibits no distension. There is no tenderness.  Musculoskeletal: Normal range of motion.  Neurological: She is alert and oriented to person, place, and time.  Skin: Skin is warm and dry.  Psychiatric: She has a normal mood and affect. Her behavior is normal. Judgment and thought content normal.  Puncture sites completely healed, almost non-visible  Imaging: Ct Abdomen W Wo Contrast  Result  Date: 09/22/2017 CLINICAL DATA:  Status post CT-guided cryoablation of clear cell interpolar left renal cell carcinoma on 06/03/2017. Follow-up. EXAM: CT ABDOMEN WITHOUT AND WITH CONTRAST TECHNIQUE: Multidetector CT imaging of the abdomen was performed following the standard protocol before and following the bolus administration of intravenous contrast. CONTRAST:  149mL ISOVUE-300 IOPAMIDOL (ISOVUE-300) INJECTION 61% COMPARISON:  03/15/2017 MRI abdomen.  02/13/2016 CT abdomen/pelvis. FINDINGS: Lower chest: No significant pulmonary nodules or acute consolidative airspace disease. Intact visualized lower sternotomy wires. Coronary atherosclerosis. Hepatobiliary: Normal liver size. No liver mass. Stable scattered punctate granulomatous liver calcifications. Cholecystectomy. Bile ducts are stable and within normal post cholecystectomy limits. CBD diameter 7 mm. Pancreas: Normal, with no mass or duct dilation. Spleen: Normal size spleen. No splenic mass. Stable scattered granulomatous calcifications in the spleen. Adrenals/Urinary Tract: Normal adrenals. No renal stones. No hydronephrosis. Simple small 1.4 cm lower right and 1.1 cm upper left renal cysts. At the ablation site in the posterior interpolar left kidney, there is a 2.4 x 2.1 cm focus with soft tissue density on the precontrast sequence and no  convincing enhancement, compatible with coagulative necrosis. Stomach/Bowel: Small hiatal hernia. Otherwise normal nondistended stomach. Visualized small and large bowel is normal caliber, with no bowel wall thickening. Vascular/Lymphatic: Atherosclerotic nonaneurysmal abdominal aorta. Patent portal, splenic, hepatic and renal veins. No evidence of renal vein tumor thrombus. No pathologically enlarged lymph nodes in the abdomen. Stable top-normal size 0.9 cm gastrohepatic ligament (series 11/image 39) and 1.0 cm portacaval (series 11/image 50) nodes. Other: No pneumoperitoneum, ascites or focal fluid collection. Musculoskeletal: No aggressive appearing focal osseous lesions. Mild thoracolumbar spondylosis. IMPRESSION: 1. Expected coagulative necrosis at the ablation site in the posterior interpolar left kidney, without convincing enhancement to suggest residual/recurrent tumor. This study will serve as a baseline for future surveillance. 2. No evidence metastatic disease in the abdomen. 3. Chronic findings include: Aortic Atherosclerosis (ICD10-I70.0). Coronary atherosclerosis. Small hiatal hernia. Electronically Signed   By: Ilona Sorrel M.D.   On: 09/22/2017 13:54    Labs:  CBC: Recent Labs    04/15/17 1149 06/01/17 1350 06/04/17 0340  WBC 7.8 11.9* 11.0*  HGB 11.3* 12.0 9.8*  HCT 33.1* 36.0 29.6*  PLT 214 264 197    COAGS: Recent Labs    04/15/17 1149  INR 0.92    BMP: Recent Labs    04/15/17 1149 06/01/17 1350 06/04/17 0340 09/22/17 1306  NA 136 135 132*  --   K 4.9 5.0 5.1  --   CL 102 98* 99*  --   CO2 26 27 24   --   GLUCOSE 189* 240* 357*  --   BUN 22* 15 24*  --   CALCIUM 9.1 9.0 8.0*  --   CREATININE 0.80 0.75 0.92 0.70  GFRNONAA >60 >60 >60  --   GFRAA >60 >60 >60  --     LIVER FUNCTION TESTS: Recent Labs    06/01/17 1350  BILITOT 0.4  AST 15  ALT 18  ALKPHOS 89  PROT 7.0  ALBUMIN 3.4*    TUMOR MARKERS: No results for input(s): AFPTM, CEA, CA199,  CHROMGRNA in the last 8760 hours.  Assessment:  Clear cell carcinoma, nuclear grade 2.  S/P Cryoablation by Dr. Kathlene Cote 06/03/2017.  Doing very well.    Return one year post ablation which will be this coming October with CT scan prior to visit.  Electronically Signed: Murrell Redden PA-C 09/22/2017, 2:22 PM   Please refer to Dr. Margaretmary Dys attestation of this note for  management and plan.

## 2017-10-27 IMAGING — DX DG CHEST 1V
1 series · 1 of 1 positions shown · non-contrast
Comparison: None.

CLINICAL DATA: Shortness of breath.  Preoperative renal surgery

EXAM:
CHEST 1 VIEW

[chest pa]
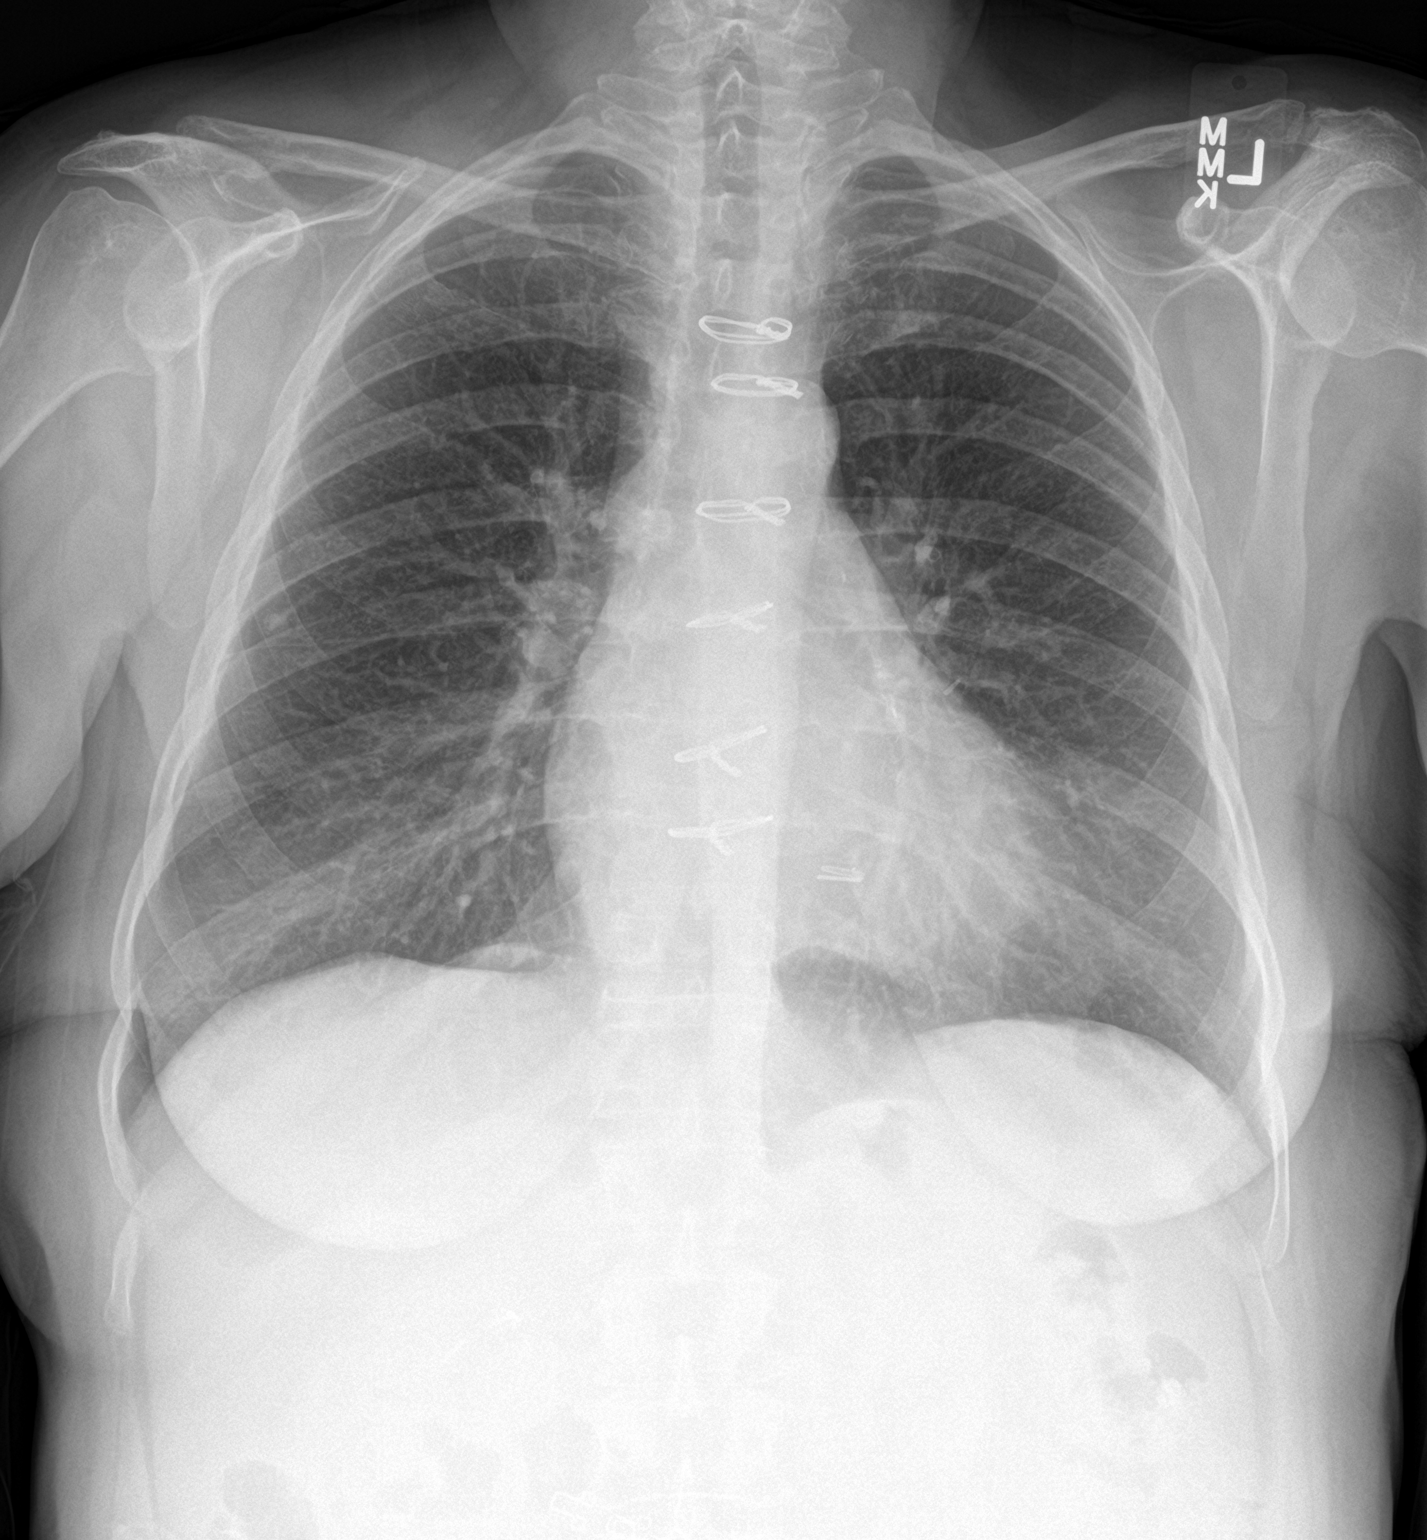

[1 of 1 positions shown; findings below may reference images not displayed]

FINDINGS: There is a 6 mm nodular opacity in the periphery of the right mid
lung. The lungs elsewhere are clear. Heart size and pulmonary
vascularity are normal. No adenopathy. Patient is status post median
sternotomy. No bone lesions.
IMPRESSION: 6 mm nodular opacity in the periphery of the right mid lung. This
finding warrants noncontrast enhanced chest CT to further assess. No
edema or consolidation. Cardiac silhouette within normal limits.

These results will be called to the ordering clinician or
representative by the Radiologist Assistant, and communication
documented in the PACS or zVision Dashboard.

## 2018-04-17 DIAGNOSIS — J019 Acute sinusitis, unspecified: Secondary | ICD-10-CM | POA: Diagnosis not present

## 2018-05-03 DIAGNOSIS — J042 Acute laryngotracheitis: Secondary | ICD-10-CM | POA: Diagnosis not present

## 2018-05-08 ENCOUNTER — Other Ambulatory Visit (HOSPITAL_COMMUNITY): Payer: Self-pay | Admitting: Interventional Radiology

## 2018-05-08 DIAGNOSIS — C642 Malignant neoplasm of left kidney, except renal pelvis: Secondary | ICD-10-CM

## 2018-05-09 ENCOUNTER — Other Ambulatory Visit: Payer: Self-pay | Admitting: Radiology

## 2018-05-09 ENCOUNTER — Encounter: Payer: Self-pay | Admitting: Radiology

## 2018-05-15 ENCOUNTER — Other Ambulatory Visit: Payer: Self-pay | Admitting: *Deleted

## 2018-05-15 DIAGNOSIS — C642 Malignant neoplasm of left kidney, except renal pelvis: Secondary | ICD-10-CM

## 2018-05-24 ENCOUNTER — Ambulatory Visit
Admission: RE | Admit: 2018-05-24 | Discharge: 2018-05-24 | Disposition: A | Discharge status: Home / Self Care | Payer: Medicare Other | Source: Ambulatory Visit | Attending: Interventional Radiology | Admitting: Interventional Radiology

## 2018-05-24 ENCOUNTER — Encounter (HOSPITAL_COMMUNITY): Payer: Self-pay

## 2018-05-24 ENCOUNTER — Ambulatory Visit (HOSPITAL_COMMUNITY)
Admission: RE | Admit: 2018-05-24 | Discharge: 2018-05-24 | Disposition: A | Discharge status: Home / Self Care | Payer: Medicare Other | Source: Ambulatory Visit | Attending: Interventional Radiology | Admitting: Interventional Radiology

## 2018-05-24 DIAGNOSIS — C642 Malignant neoplasm of left kidney, except renal pelvis: Secondary | ICD-10-CM | POA: Diagnosis not present

## 2018-05-24 HISTORY — PX: IR RADIOLOGIST EVAL & MGMT: IMG5224

## 2018-05-24 LAB — POCT I-STAT CREATININE: CREATININE: 0.9 mg/dL (ref 0.44–1.00)

## 2018-05-24 MED ORDER — IOHEXOL 300 MG/ML  SOLN
100.0000 mL | Freq: Once | INTRAMUSCULAR | Status: AC | PRN
  Administered 2018-05-24: 100 mL via INTRAVENOUS

## 2018-05-24 MED ORDER — SODIUM CHLORIDE 0.9 % IJ SOLN
INTRAMUSCULAR | Status: AC
  Filled 2018-05-24: qty 50

## 2018-05-24 NOTE — Progress Notes (Signed)
Referring Physician(s): Yamagata,Glenn  Chief Complaint: The patient is seen in follow up today s/p left renal cryoablation 06/03/17  History of present illness: Monique Sims is a 72 y.o. female status post cryoablation of a left renal carcinoma on 06/03/2017. Biopsy of the mass at the time of the procedure demonstrated clear cell carcinoma, nuclear grade 2. She has continued to do well since procedure.  She denies fever, chills, abdominal pain, back pain, dysuria, or hematuria.  She does not follow with a Dealer. She presents to IR clinic today for follow-up of her procedure and imaging. She denies concerns or complaints today.   Past Medical History:  Diagnosis Date  . Anxiety   . Arthritis    mild  . Coronary artery disease   . Diabetes mellitus without complication ([place])    type 2  . Dyspnea   . Headache    hx of migraines  . Hypercholesterolemia   . Hypertension   . Left renal mass   . Myocardial infarction ([place])   . PONV (postoperative nausea and vomiting)    pt states during her heart surgery was very mean when she awaken from anesthesia     Past Surgical History:  Procedure Laterality Date  . APPENDECTOMY    . CARDIAC CATHETERIZATION    . CARPAL TUNNEL RELEASE    . CHOLECYSTECTOMY    . CORONARY ARTERY BYPASS GRAFT  05/2016  . EYE SURGERY     bil cataracts  . IR RADIOLOGIST EVAL & MGMT  03/09/2017  . IR RADIOLOGIST EVAL & MGMT  07/07/2017  . IR RADIOLOGIST EVAL & MGMT  09/22/2017  . RADIOLOGY WITH ANESTHESIA Left 06/03/2017   Procedure: RADIOLOGY WITH ANESTHESIA-RENAL CRYOABLATION;  Surgeon: Aletta Edouard, MD;  Location: [facility];  Service: Radiology;  Laterality: Left;  . RENAL CRYOABLATION      Allergies: Codeine; Dolobid [diflunisal]; Doxycycline; Levofloxacin; and Zofran [ondansetron hcl]  Medications: Prior to Admission medications   Medication Sig Start Date End Date Taking? Authorizing Provider  acetaminophen (TYLENOL) 500 MG tablet Take 1,000  mg by mouth every 6 (six) hours as needed (for pain.).   Yes [provider]  albuterol (PROVENTIL HFA;VENTOLIN HFA) 108 (90 Base) MCG/ACT inhaler Inhale 1-2 puffs into the lungs every 6 (six) hours as needed for wheezing or shortness of breath.   Yes [provider]  ALPRAZolam (XANAX) 0.5 MG tablet Take 0.25-0.5 mg by mouth 3 (three) times daily as needed for anxiety.    Yes [provider]  aspirin EC 81 MG tablet Take 81 mg by mouth once a week.   Yes [provider]  ezetimibe (ZETIA) 10 MG tablet Take 10 mg by mouth daily. 02/02/17  Yes [provider]  famotidine (PEPCID) 20 MG tablet Take 20 mg by mouth 2 (two) times daily. 02/12/17  Yes [provider]  FLUoxetine (PROZAC) 20 MG capsule Take 20 mg by mouth daily. 02/26/17  Yes [provider]  metoprolol tartrate (LOPRESSOR) 25 MG tablet Take 25 mg by mouth 2 (two) times daily. 01/21/17  Yes [provider]  mirtazapine (REMERON) 15 MG tablet Take 15 mg by mouth at bedtime. 02/02/17  Yes [provider]  pravastatin (PRAVACHOL) 40 MG tablet Take 40 mg by mouth at bedtime. 02/26/17  Yes [provider]  promethazine (PHENERGAN) 12.5 MG tablet Take 12.5 mg by mouth every 6 (six) hours as needed for nausea or vomiting.   Yes [provider]  sitaGLIPtin-metformin (JANUMET) 50-1000 MG  tablet Take 1 tablet by mouth 2 (two) times daily with a meal.   Yes [provider]     No family history on file.  Social History   Socioeconomic History  . Marital status: Widowed    Spouse name: Not on file  . Number of children: Not on file  . Years of education: Not on file  . Highest education level: Not on file  Occupational History  . Not on file  Social Needs  . Financial resource strain: Not on file  . Food insecurity:    Worry: Not on file    Inability: Not on file  . Transportation needs:    Medical: Not on file    Non-medical: Not on  file  Tobacco Use  . Smoking status: Current Every Day Smoker    Packs/day: 0.50    Years: 50.00    Pack years: 25.00    Types: Cigarettes  . Smokeless tobacco: Never Used  Substance and Sexual Activity  . Alcohol use: No  . Drug use: No  . Sexual activity: Not Currently  Lifestyle  . Physical activity:    Days per week: Not on file    Minutes per session: Not on file  . Stress: Not on file  Relationships  . Social connections:    Talks on phone: Not on file    Gets together: Not on file    Attends religious service: Not on file    Active member of club or organization: Not on file    Attends meetings of clubs or organizations: Not on file    Relationship status: Not on file  Other Topics Concern  . Not on file  Social History Narrative  . Not on file     Vital Signs: BP (!) 165/69   Pulse 69   Temp 98.1 F (36.7 C) (Oral)   Resp 15   Ht _0  (1.6 m)   Wt 157 lb (71.2 kg)   SpO2 98%   BMI 27.81 kg/m   Physical Exam  NAD, alert, pleasant Heart: regular rate and rhythm Chest/Lungs:  Clear to auscultation bilaterally, no adventitous sounds Abdomen: soft, non-tender, non-distended Back:  No tenderness   Imaging: No results found.  Labs:  CBC: Recent Labs    06/01/17 1350 06/04/17 0340  WBC 11.9* 11.0*  HGB 12.0 9.8*  HCT 36.0 29.6*  PLT 264 197    COAGS: No results for input(s): INR, APTT in the last 8760 hours.  BMP: Recent Labs    06/01/17 1350 06/04/17 0340 09/22/17 1306 05/24/18 1206  NA 135 132*  --   --   K 5.0 5.1  --   --   CL 98* 99*  --   --   CO2 27 24  --   --   GLUCOSE 240* 357*  --   --   BUN 15 24*  --   --   CALCIUM 9.0 8.0*  --   --   CREATININE 0.75 0.92 0.70 0.90  GFRNONAA >60 >60  --   --   GFRAA >60 >60  --   --     LIVER FUNCTION TESTS: Recent Labs    06/01/17 1350  BILITOT 0.4  AST 15  ALT 18  ALKPHOS 89  PROT 7.0  ALBUMIN 3.4*    Assessment: Clear cell renal cell carcinoma s/p cryoablation  procedure 06/03/17 Patient presents for 1 year follow-up of her cryoablation procedure.  She has been doing well and  is without complaint today. She has no concerns or new symptoms.  CT Abdomen obtained today and reviewed by Dr. Kathlene Cote who has met with patient to discuss.  No new findings or concerns today.  Overall, ablation site appears stable without signs of enhancement.  Plan for follow-up with repeat imaging in 1 year. Schedulers will contact her with date and time of her appointment.   Signed: Docia Barrier, PA 05/24/2018, 2:06 PM   Please refer to Dr. Kathlene Cote attestation of this note for management and plan.

## 2018-06-13 DIAGNOSIS — L821 Other seborrheic keratosis: Secondary | ICD-10-CM | POA: Insufficient documentation

## 2018-06-13 DIAGNOSIS — R42 Dizziness and giddiness: Secondary | ICD-10-CM | POA: Insufficient documentation

## 2018-06-21 ENCOUNTER — Other Ambulatory Visit: Payer: Self-pay

## 2018-06-21 NOTE — Patient Outreach (Signed)
[facility]) Care Management  06/21/2018  Monique Sims 1945/12/10 [id number]   Medication Adherence call to Monique Sims patient's telephone number is disconnected patient is due on Janumet 50/1000 under [place].  [facility] Management Direct Dial [phone]  Fax [phone] [email or web address]

## 2018-08-30 DIAGNOSIS — H1011 Acute atopic conjunctivitis, right eye: Secondary | ICD-10-CM | POA: Insufficient documentation

## 2018-11-29 DIAGNOSIS — B372 Candidiasis of skin and nail: Secondary | ICD-10-CM | POA: Insufficient documentation

## 2019-05-09 ENCOUNTER — Other Ambulatory Visit: Payer: Self-pay | Admitting: Interventional Radiology

## 2019-05-09 ENCOUNTER — Other Ambulatory Visit: Payer: Self-pay

## 2019-05-09 DIAGNOSIS — N2889 Other specified disorders of kidney and ureter: Secondary | ICD-10-CM

## 2019-05-09 DIAGNOSIS — C642 Malignant neoplasm of left kidney, except renal pelvis: Secondary | ICD-10-CM

## 2019-05-16 ENCOUNTER — Other Ambulatory Visit: Payer: Self-pay

## 2019-05-16 ENCOUNTER — Ambulatory Visit (HOSPITAL_COMMUNITY)
Admission: RE | Admit: 2019-05-16 | Discharge: 2019-05-16 | Disposition: A | Discharge status: Home / Self Care | Payer: Medicare Other | Source: Ambulatory Visit | Attending: Interventional Radiology | Admitting: Interventional Radiology

## 2019-05-16 DIAGNOSIS — C642 Malignant neoplasm of left kidney, except renal pelvis: Secondary | ICD-10-CM | POA: Diagnosis not present

## 2019-05-16 LAB — POCT I-STAT CREATININE: Creatinine, Ser: 0.7 mg/dL (ref 0.44–1.00)

## 2019-05-16 MED ORDER — IOHEXOL 300 MG/ML  SOLN
100.0000 mL | Freq: Once | INTRAMUSCULAR | Status: AC | PRN
  Administered 2019-05-16: 14:00:00 100 mL via INTRAVENOUS

## 2019-05-16 MED ORDER — SODIUM CHLORIDE (PF) 0.9 % IJ SOLN
INTRAMUSCULAR | Status: AC
  Filled 2019-05-16: qty 50

## 2019-05-22 ENCOUNTER — Other Ambulatory Visit: Payer: Self-pay

## 2019-05-22 ENCOUNTER — Ambulatory Visit
Admission: RE | Admit: 2019-05-22 | Discharge: 2019-05-22 | Disposition: A | Discharge status: Home / Self Care | Payer: Medicare Other | Source: Ambulatory Visit | Attending: Interventional Radiology | Admitting: Interventional Radiology

## 2019-05-22 ENCOUNTER — Encounter: Payer: Self-pay | Admitting: *Deleted

## 2019-05-22 DIAGNOSIS — C642 Malignant neoplasm of left kidney, except renal pelvis: Secondary | ICD-10-CM

## 2019-05-22 HISTORY — PX: IR RADIOLOGIST EVAL & MGMT: IMG5224

## 2019-05-22 NOTE — Progress Notes (Signed)
Chief Complaint: Patient was consulted remotely today (TeleHealth) for follow-up after cryoablation of a left renal carcinoma.    History of Present Illness: Monique Sims is a 73 y.o. female status post cryoablation of a biopsy-proven clear cell left renal carcinoma on 06/03/2017.  Monique Sims has been doing well over the last year since prior imaging.  Her only complaint is some chronic fatigue.  She denies any urinary symptoms, abdominal pain or flank pain.  Past Medical History:  Diagnosis Date  . Anxiety   . Arthritis    mild  . Coronary artery disease   . Diabetes mellitus without complication ([place])    type 2  . Dyspnea   . Headache    hx of migraines  . Hypercholesterolemia   . Hypertension   . Left renal mass   . Myocardial infarction ([place])   . PONV (postoperative nausea and vomiting)    pt states during her heart surgery was very mean when she awaken from anesthesia     Past Surgical History:  Procedure Laterality Date  . APPENDECTOMY    . CARDIAC CATHETERIZATION    . CARPAL TUNNEL RELEASE    . CHOLECYSTECTOMY    . CORONARY ARTERY BYPASS GRAFT  05/2016  . EYE SURGERY     bil cataracts  . IR RADIOLOGIST EVAL & MGMT  03/09/2017  . IR RADIOLOGIST EVAL & MGMT  07/07/2017  . IR RADIOLOGIST EVAL & MGMT  09/22/2017  . IR RADIOLOGIST EVAL & MGMT  05/24/2018  . RADIOLOGY WITH ANESTHESIA Left 06/03/2017   Procedure: RADIOLOGY WITH ANESTHESIA-RENAL CRYOABLATION;  Surgeon: Aletta Edouard, MD;  Location: [facility];  Service: Radiology;  Laterality: Left;  . RENAL CRYOABLATION      Allergies: Codeine, Dolobid [diflunisal], Doxycycline, Levofloxacin, and Zofran [ondansetron hcl]  Medications: Prior to Admission medications   Medication Sig Start Date End Date Taking? Authorizing Provider  acetaminophen (TYLENOL) 500 MG tablet Take 1,000 mg by mouth every 6 (six) hours as needed (for pain.).    [provider]  albuterol (PROVENTIL HFA;VENTOLIN HFA) 108 (90  Base) MCG/ACT inhaler Inhale 1-2 puffs into the lungs every 6 (six) hours as needed for wheezing or shortness of breath.    [provider]  ALPRAZolam Duanne Moron) 0.5 MG tablet Take 0.25-0.5 mg by mouth 3 (three) times daily as needed for anxiety.     [provider]  aspirin EC 81 MG tablet Take 81 mg by mouth once a week.    [provider]  ezetimibe (ZETIA) 10 MG tablet Take 10 mg by mouth daily. 02/02/17   [provider]  famotidine (PEPCID) 20 MG tablet Take 20 mg by mouth 2 (two) times daily. 02/12/17   [provider]  FLUoxetine (PROZAC) 20 MG capsule Take 20 mg by mouth daily. 02/26/17   [provider]  metoprolol tartrate (LOPRESSOR) 25 MG tablet Take 25 mg by mouth 2 (two) times daily. 01/21/17   [provider]  mirtazapine (REMERON) 15 MG tablet Take 15 mg by mouth at bedtime. 02/02/17   [provider]  pravastatin (PRAVACHOL) 40 MG tablet Take 40 mg by mouth at bedtime. 02/26/17   [provider]  promethazine (PHENERGAN) 12.5 MG tablet Take 12.5 mg by mouth every 6 (six) hours as needed for nausea or vomiting.    [provider]  sitaGLIPtin-metformin (JANUMET) 50-1000 MG tablet Take 1 tablet by mouth 2 (two) times daily with a meal.    [provider]  No family history on file.  Social History   Socioeconomic History  . Marital status: Widowed    Spouse name: Not on file  . Number of children: Not on file  . Years of education: Not on file  . Highest education level: Not on file  Occupational History  . Not on file  Social Needs  . Financial resource strain: Not on file  . Food insecurity    Worry: Not on file    Inability: Not on file  . Transportation needs    Medical: Not on file    Non-medical: Not on file  Tobacco Use  . Smoking status: Current Every Day Smoker    Packs/day: 0.50    Years: 50.00    Pack years: 25.00    Types: Cigarettes  . Smokeless tobacco:  Never Used  Substance and Sexual Activity  . Alcohol use: No  . Drug use: No  . Sexual activity: Not Currently  Lifestyle  . Physical activity    Days per week: Not on file    Minutes per session: Not on file  . Stress: Not on file  Relationships  . Social Herbalist on phone: Not on file    Gets together: Not on file    Attends religious service: Not on file    Active member of club or organization: Not on file    Attends meetings of clubs or organizations: Not on file    Relationship status: Not on file  Other Topics Concern  . Not on file  Social History Narrative  . Not on file    ECOG Status: 0 - Asymptomatic  Review of Systems  Constitutional: Positive for fatigue. Negative for chills and fever.  Respiratory: Negative.   Cardiovascular: Negative.   Gastrointestinal: Negative.   Genitourinary: Negative.   Musculoskeletal: Negative.   Neurological: Negative.     Review of Systems: A 12 point ROS discussed and pertinent positives are indicated in the HPI above.  All other systems are negative.  Physical Exam No direct physical exam was performed (except for noted visual exam findings with Video Visits).    Vital Signs: There were no vitals taken for this visit.  Imaging: Ct Abdomen Pelvis W Wo Contrast  Result Date: 05/16/2019 CLINICAL DATA:  History of clear cell renal cell carcinoma, status post ablation in August of 2018 EXAM: CT ABDOMEN AND PELVIS WITHOUT AND WITH CONTRAST TECHNIQUE: Multidetector CT imaging of the abdomen and pelvis was performed following the standard protocol before and following the bolus administration of intravenous contrast. CONTRAST:  170mL OMNIPAQUE IOHEXOL 300 MG/ML  SOLN COMPARISON:  05/24/2018 FINDINGS: Lower chest: Signs of median sternotomy. Stable mild cardiac enlargement. No signs of pleural or pericardial effusion. No consolidation. Heart is incompletely imaged. Hepatobiliary: Lobular hepatic contours. Similar to prior  exam. No signs of focal, suspicious hepatic lesion with patent portal vein. Post cholecystectomy without evidence of biliary ductal dilation. Pancreas: Unremarkable. No pancreatic ductal dilatation or surrounding inflammatory changes. Spleen: Normal in size without focal abnormality. Adrenals/Urinary Tract: Adrenal glands are normal. Along the posterior hilar lip and extending into left upper pole signs of renal tumor ablation, current axial measurements 2.4 x 1.9 cm. When measured in a similar fashion this area measured 2.5 x 2.3 cm on the prior exam. No signs of recurrence in the ablation zone. Signs of renal cysts bilaterally. Stomach/Bowel: Small hiatal hernia. No signs of bowel obstruction. Signs of colonic diverticulosis. Vascular/Lymphatic: Patent abdominal vasculature with moderate  calcific and noncalcific atherosclerosis. No signs of retroperitoneal adenopathy. Mildly enlarged lymph nodes about the celiac/porta hepatis stable since the prior exam. Reproductive: Uterus remains in place, normal by CT. Other: No pelvic adenopathy. Configuration of urinary bladder suggest pelvic floor dysfunction. Musculoskeletal: No signs of acute musculoskeletal process. IMPRESSION: 1. Signs of left renal tumor ablation without signs of disease recurrence. 2. Lobulated hepatic contours with fissural widening, correlate with any clinical or laboratory evidence of liver disease. No signs of portal hypertension. 3. Configuration of urinary bladder and pelvic floor musculature may be seen in the setting of pelvic floor dysfunction. Clinical correlation may be helpful. 4.  Aortic Atherosclerosis (ICD10-I70.0). Electronically Signed   By: Zetta Bills M.D.   On: 05/16/2019 14:58    Labs:  CBC: No results for input(s): WBC, HGB, HCT, PLT in the last 8760 hours.  COAGS: No results for input(s): INR, APTT in the last 8760 hours.  BMP: Recent Labs    05/24/18 1206 05/16/19 1343  CREATININE 0.90 0.70     Assessment  and Plan:  Renal function is stable.  I spoke with Monique Sims over the phone and reviewed findings from the recent follow-up CT dated 05/16/2019. This demonstrates further retraction of ablation scar at the level of the treated left renal carcinoma.  There is no evidence of recurrent enhancing tumor.  Stable bilateral renal cysts.  I recommended a follow-up CT in 1 year.   Electronically Signed: Azzie Roup 05/22/2019, 1:24 PM   I spent a total of 10 Minutes in remote  clinical consultation, greater than 50% of which was counseling/coordinating care post ablation of a left renal carcinoma.    Visit type: Audio only (telephone). Audio (no video) only due to patient's lack of internet/smartphone capability. Alternative for in-person consultation at [facility], [address] [place] [address], [place], [place]. This visit type was conducted due to national recommendations for restrictions regarding the COVID-19 Pandemic (e.g. social distancing).  This format is felt to be most appropriate for this patient at this time.  All issues noted in this document were discussed and addressed.

## 2019-09-10 DIAGNOSIS — I5022 Chronic systolic (congestive) heart failure: Secondary | ICD-10-CM | POA: Insufficient documentation

## 2019-09-10 DIAGNOSIS — F3342 Major depressive disorder, recurrent, in full remission: Secondary | ICD-10-CM | POA: Insufficient documentation

## 2019-11-27 IMAGING — CT CT ABD-PEL WO/W CM
3 of 15 series · 10 of 46 positions shown, 16 images · IV contrast (omnipaque)
Comparison: 05/24/2018

CLINICAL DATA: History of clear cell renal cell carcinoma, status
post ablation in Thursday March, 2017

EXAM:
CT ABDOMEN AND PELVIS WITHOUT AND WITH CONTRAST
TECHNIQUE: Multidetector CT imaging of the abdomen and pelvis was performed
following the standard protocol before and following the bolus
administration of intravenous contrast.
CONTRAST:  100mL OMNIPAQUE IOHEXOL 300 MG/ML  SOLN

[Series 3: coronal pre · coronal · non-contrast · 0.61mm/px · 1 of 91 slices shown, 2 images]
[im 46/91  soft-tissue]
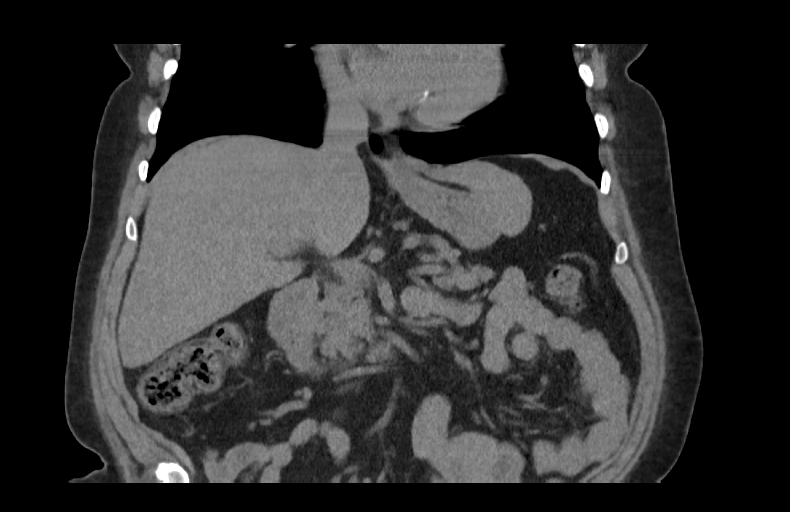
[im 46/91  bone]
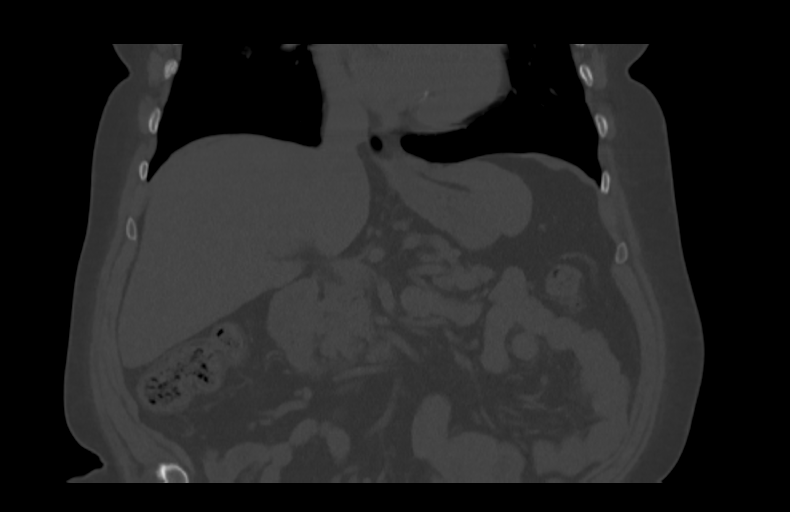

[Series 6: axial arterial · axial · arterial · 0.81mm/px · z∈[-127,+5]mm · 3 of 89 slices shown]
[im 23/89  soft-tissue]
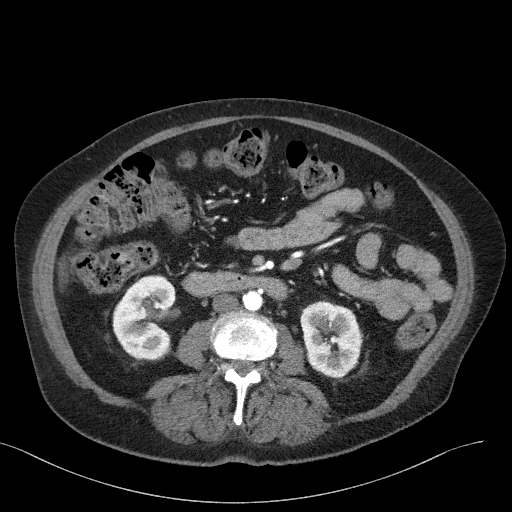
[im 45/89  soft-tissue]
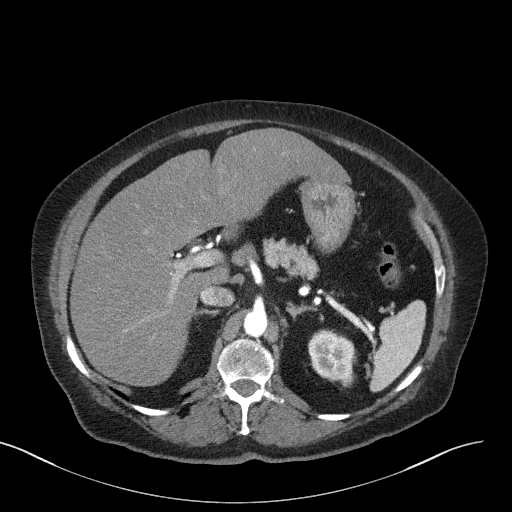
[im 67/89  soft-tissue]
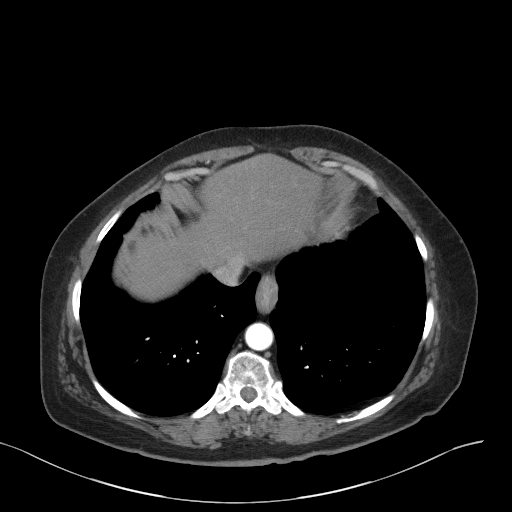

[Series 11: axial nephro · axial · 0.81mm/px · z∈[-313,+5]mm · 6 of 150 slices shown, 11 images]
[im 22/150  soft-tissue]
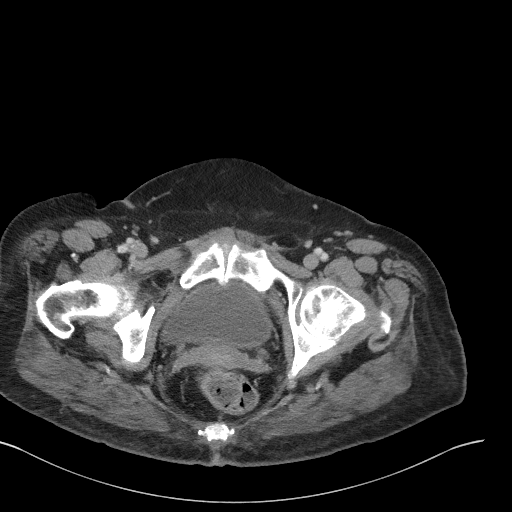
[im 22/150  bone]
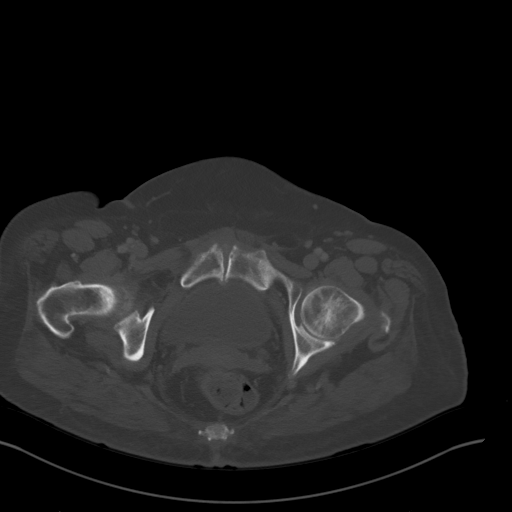
[im 43/150  soft-tissue]
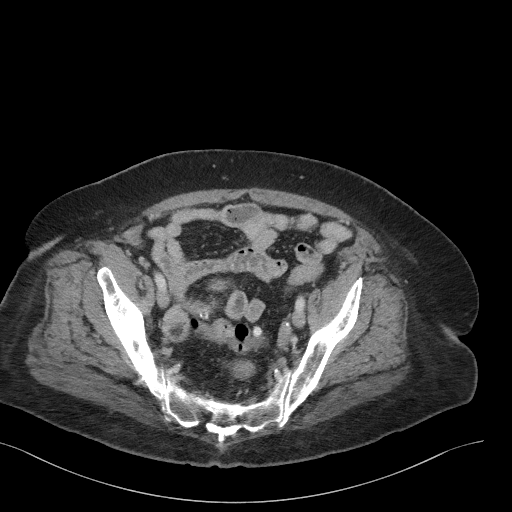
[im 64/150  soft-tissue]
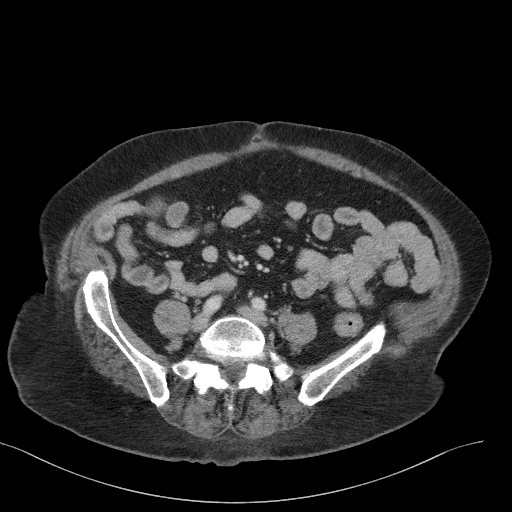
[im 64/150  lung]
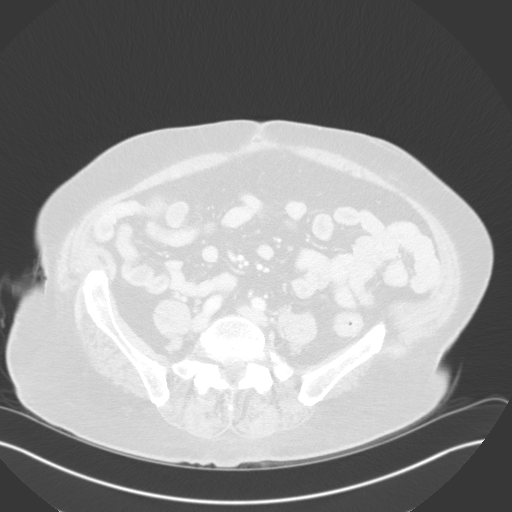
[im 86/150  soft-tissue]
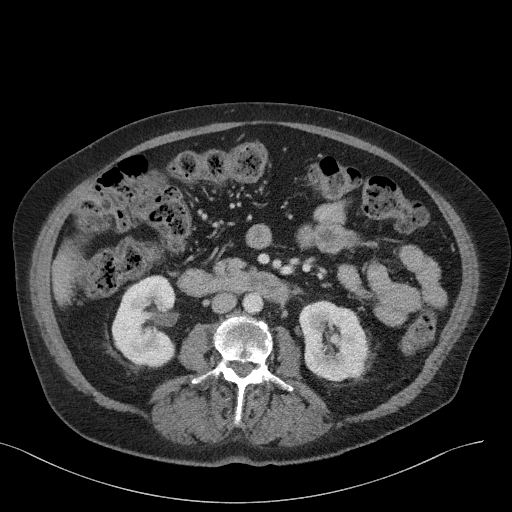
[im 86/150  lung]
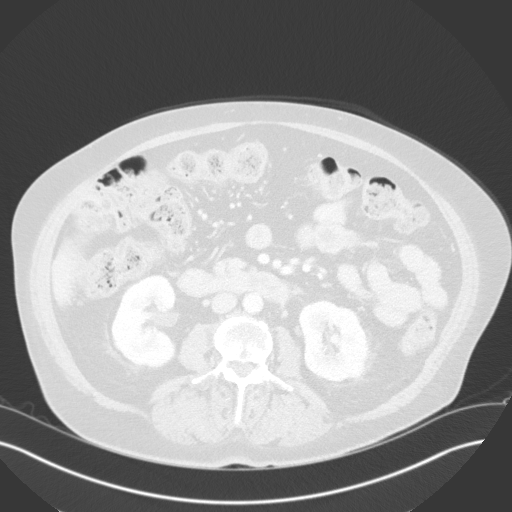
[im 107/150  soft-tissue]
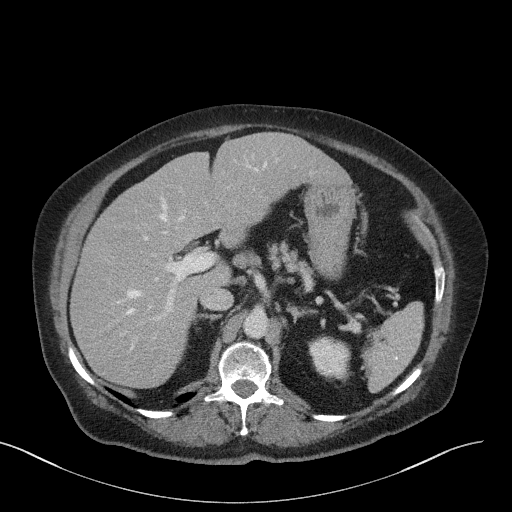
[im 107/150  lung]
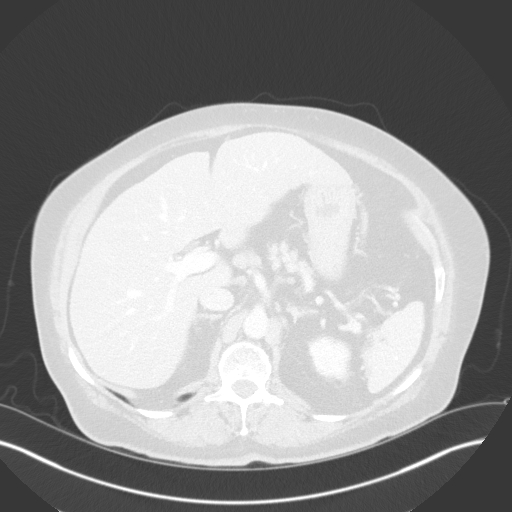
[im 128/150  soft-tissue]
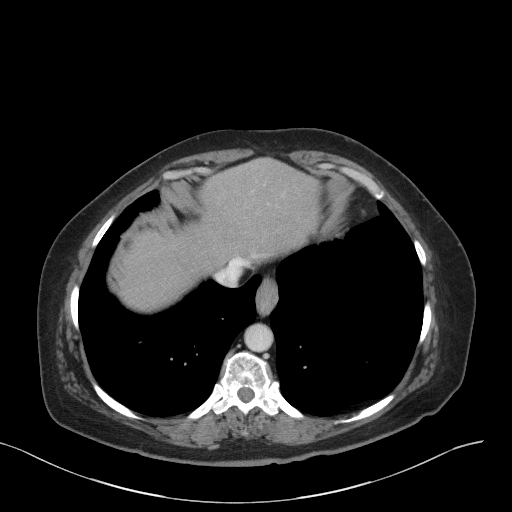
[im 128/150  lung]
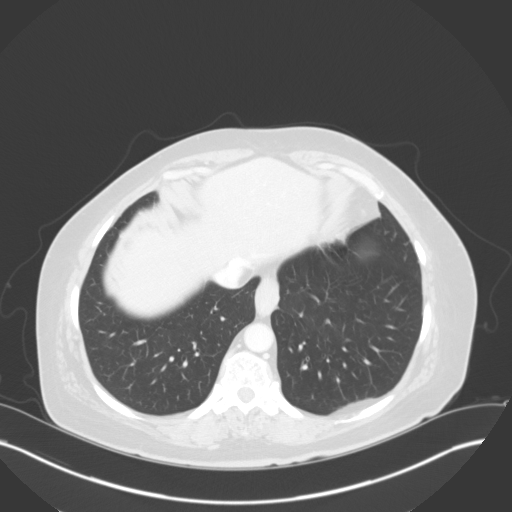

[10 of 46 positions shown; findings below may reference images not displayed]

FINDINGS: Lower chest: Signs of median sternotomy. Stable mild cardiac
enlargement. No signs of pleural or pericardial effusion. No
consolidation. Heart is incompletely imaged.

Hepatobiliary: Lobular hepatic contours. Similar to prior exam. No
signs of focal, suspicious hepatic lesion with patent portal vein.
Post cholecystectomy without evidence of biliary ductal dilation.

Pancreas: Unremarkable. No pancreatic ductal dilatation or
surrounding inflammatory changes.

Spleen: Normal in size without focal abnormality.

Adrenals/Urinary Tract: Adrenal glands are normal.

Along the posterior hilar lip and extending into left upper pole
signs of renal tumor ablation, current axial measurements 2.4 x
cm. When measured in a similar fashion this area measured 2.5 x
cm on the prior exam. No signs of recurrence in the ablation zone.

Signs of renal cysts bilaterally.

Stomach/Bowel: Small hiatal hernia. No signs of bowel obstruction.
Signs of colonic diverticulosis.

Vascular/Lymphatic: Patent abdominal vasculature with moderate
calcific and noncalcific atherosclerosis.

No signs of retroperitoneal adenopathy. Mildly enlarged lymph nodes
about the celiac/porta hepatis stable since the prior exam.

Reproductive: Uterus remains in place, normal by CT.

Other: No pelvic adenopathy. Configuration of urinary bladder
suggest pelvic floor dysfunction.

Musculoskeletal: No signs of acute musculoskeletal process.
IMPRESSION: 1. Signs of left renal tumor ablation without signs of disease
recurrence.
2. Lobulated hepatic contours with fissural widening, correlate with
any clinical or laboratory evidence of liver disease. No signs of
portal hypertension.
3. Configuration of urinary bladder and pelvic floor musculature may
be seen in the setting of pelvic floor dysfunction. Clinical
correlation may be helpful.
4.  Aortic Atherosclerosis ([id number]).

## 2019-12-13 DIAGNOSIS — M47816 Spondylosis without myelopathy or radiculopathy, lumbar region: Secondary | ICD-10-CM | POA: Insufficient documentation

## 2020-01-08 DIAGNOSIS — M5416 Radiculopathy, lumbar region: Secondary | ICD-10-CM | POA: Insufficient documentation

## 2020-05-13 ENCOUNTER — Other Ambulatory Visit: Payer: Self-pay | Admitting: Interventional Radiology

## 2020-05-13 DIAGNOSIS — C642 Malignant neoplasm of left kidney, except renal pelvis: Secondary | ICD-10-CM

## 2020-07-24 ENCOUNTER — Other Ambulatory Visit: Payer: Self-pay

## 2020-07-24 DIAGNOSIS — C642 Malignant neoplasm of left kidney, except renal pelvis: Secondary | ICD-10-CM

## 2020-07-24 DIAGNOSIS — N2889 Other specified disorders of kidney and ureter: Secondary | ICD-10-CM

## 2020-08-06 ENCOUNTER — Ambulatory Visit (HOSPITAL_COMMUNITY)
Admission: RE | Admit: 2020-08-06 | Discharge: 2020-08-06 | Disposition: A | Discharge status: Home / Self Care | Payer: Medicare Other | Source: Ambulatory Visit | Attending: Interventional Radiology | Admitting: Interventional Radiology

## 2020-08-06 ENCOUNTER — Other Ambulatory Visit: Payer: Self-pay

## 2020-08-06 ENCOUNTER — Encounter (HOSPITAL_COMMUNITY): Payer: Self-pay

## 2020-08-06 DIAGNOSIS — C642 Malignant neoplasm of left kidney, except renal pelvis: Secondary | ICD-10-CM | POA: Diagnosis present

## 2020-08-06 HISTORY — DX: Malignant (primary) neoplasm, unspecified: C80.1

## 2020-08-06 LAB — POCT I-STAT CREATININE: Creatinine, Ser: 1.1 mg/dL — ABNORMAL HIGH (ref 0.44–1.00)

## 2020-08-06 MED ORDER — IOHEXOL 300 MG/ML  SOLN
100.0000 mL | Freq: Once | INTRAMUSCULAR | Status: AC | PRN
  Administered 2020-08-06: 100 mL via INTRAVENOUS

## 2020-08-14 ENCOUNTER — Telehealth: Payer: Medicare Other

## 2020-08-21 ENCOUNTER — Ambulatory Visit (HOSPITAL_COMMUNITY): Payer: Medicare Other

## 2020-08-26 ENCOUNTER — Telehealth: Payer: Medicare Other

## 2020-09-26 ENCOUNTER — Ambulatory Visit (HOSPITAL_COMMUNITY)
Admission: RE | Admit: 2020-09-26 | Discharge: 2020-09-26 | Disposition: A | Discharge status: Home / Self Care | Payer: Medicare Other | Source: Ambulatory Visit | Attending: Interventional Radiology | Admitting: Interventional Radiology

## 2020-09-26 ENCOUNTER — Other Ambulatory Visit: Payer: Self-pay

## 2020-09-26 ENCOUNTER — Encounter (HOSPITAL_COMMUNITY): Payer: Self-pay

## 2020-09-26 DIAGNOSIS — C642 Malignant neoplasm of left kidney, except renal pelvis: Secondary | ICD-10-CM | POA: Diagnosis not present

## 2020-09-26 LAB — POCT I-STAT CREATININE: Creatinine, Ser: 0.9 mg/dL (ref 0.44–1.00)

## 2020-09-26 MED ORDER — IOHEXOL 300 MG/ML  SOLN
100.0000 mL | Freq: Once | INTRAMUSCULAR | Status: AC | PRN
  Administered 2020-09-26: 100 mL via INTRAVENOUS

## 2020-10-02 ENCOUNTER — Other Ambulatory Visit: Payer: Self-pay

## 2020-10-02 ENCOUNTER — Encounter: Payer: Self-pay | Admitting: *Deleted

## 2020-10-02 ENCOUNTER — Ambulatory Visit
Admission: RE | Admit: 2020-10-02 | Discharge: 2020-10-02 | Disposition: A | Discharge status: Home / Self Care | Payer: Medicare Other | Source: Ambulatory Visit | Attending: Interventional Radiology | Admitting: Interventional Radiology

## 2020-10-02 DIAGNOSIS — C642 Malignant neoplasm of left kidney, except renal pelvis: Secondary | ICD-10-CM

## 2020-10-02 HISTORY — PX: IR RADIOLOGIST EVAL & MGMT: IMG5224

## 2020-10-02 NOTE — Progress Notes (Signed)
Chief Complaint: Patient was consulted remotely today (TeleHealth) for follow-up after cryoablation of a left renal carcinoma.  History of Present Illness: Monique Sims is a 75 y.o. female status post cryoablation of a biopsy-proven clear cell left renal carcinoma on 06/03/2017.  Monique Sims has been doing well since prior imaging. She denies any urinary symptoms, abdominal pain or flank pain.  Past Medical History:  Diagnosis Date  . Anxiety   . Arthritis    mild  . clear cell renal ca dx'd 03/2017   thermal ablation  . Coronary artery disease   . Diabetes mellitus without complication ([place])    type 2  . Dyspnea   . Headache    hx of migraines  . Hypercholesterolemia   . Hypertension   . Left renal mass   . Myocardial infarction ([place])   . PONV (postoperative nausea and vomiting)    pt states during her heart surgery was very mean when she awaken from anesthesia     Past Surgical History:  Procedure Laterality Date  . APPENDECTOMY    . CARDIAC CATHETERIZATION    . CARPAL TUNNEL RELEASE    . CHOLECYSTECTOMY    . CORONARY ARTERY BYPASS GRAFT  05/2016  . EYE SURGERY     bil cataracts  . IR RADIOLOGIST EVAL & MGMT  03/09/2017  . IR RADIOLOGIST EVAL & MGMT  07/07/2017  . IR RADIOLOGIST EVAL & MGMT  09/22/2017  . IR RADIOLOGIST EVAL & MGMT  05/24/2018  . IR RADIOLOGIST EVAL & MGMT  05/22/2019  . RADIOLOGY WITH ANESTHESIA Left 06/03/2017   Procedure: RADIOLOGY WITH ANESTHESIA-RENAL CRYOABLATION;  Surgeon: Aletta Edouard, MD;  Location: [facility];  Service: Radiology;  Laterality: Left;  . RENAL CRYOABLATION      Allergies: Codeine, Dolobid [diflunisal], Doxycycline, Levofloxacin, and Zofran [ondansetron hcl]  Medications: Prior to Admission medications   Medication Sig Start Date End Date Taking? Authorizing Provider  acetaminophen (TYLENOL) 500 MG tablet Take 1,000 mg by mouth every 6 (six) hours as needed (for pain.).    [provider]  albuterol  (PROVENTIL HFA;VENTOLIN HFA) 108 (90 Base) MCG/ACT inhaler Inhale 1-2 puffs into the lungs every 6 (six) hours as needed for wheezing or shortness of breath.    [provider]  ALPRAZolam Duanne Moron) 0.5 MG tablet Take 0.25-0.5 mg by mouth 3 (three) times daily as needed for anxiety.     [provider]  aspirin EC 81 MG tablet Take 81 mg by mouth once a week.    [provider]  ezetimibe (ZETIA) 10 MG tablet Take 10 mg by mouth daily. 02/02/17   [provider]  famotidine (PEPCID) 20 MG tablet Take 20 mg by mouth 2 (two) times daily. 02/12/17   [provider]  FLUoxetine (PROZAC) 20 MG capsule Take 20 mg by mouth daily. 02/26/17   [provider]  metoprolol tartrate (LOPRESSOR) 25 MG tablet Take 25 mg by mouth 2 (two) times daily. 01/21/17   [provider]  mirtazapine (REMERON) 15 MG tablet Take 15 mg by mouth at bedtime. 02/02/17   [provider]  pravastatin (PRAVACHOL) 40 MG tablet Take 40 mg by mouth at bedtime. 02/26/17   [provider]  promethazine (PHENERGAN) 12.5 MG tablet Take 12.5 mg by mouth every 6 (six) hours as needed for nausea or vomiting.    [provider]  sitaGLIPtin-metformin (JANUMET) 50-1000 MG tablet Take 1 tablet by mouth 2 (two) times daily with a meal.  [provider]     No family history on file.  Social History   Socioeconomic History  . Marital status: Widowed    Spouse name: Not on file  . Number of children: Not on file  . Years of education: Not on file  . Highest education level: Not on file  Occupational History  . Not on file  Tobacco Use  . Smoking status: Current Every Day Smoker    Packs/day: 0.50    Years: 50.00    Pack years: 25.00    Types: Cigarettes  . Smokeless tobacco: Never Used  Vaping Use  . Vaping Use: Never used  Substance and Sexual Activity  . Alcohol use: No  . Drug use: No  . Sexual activity: Not Currently  Other Topics  Concern  . Not on file  Social History Narrative  . Not on file   Social Determinants of Health   Financial Resource Strain: Not on file  Food Insecurity: Not on file  Transportation Needs: Not on file  Physical Activity: Not on file  Stress: Not on file  Social Connections: Not on file    ECOG Status: 0 - Asymptomatic  Review of Systems  Constitutional: Negative.   Respiratory: Negative.   Cardiovascular: Negative.   Gastrointestinal: Negative.   Genitourinary: Negative.   Musculoskeletal: Negative.   Neurological: Negative.     Review of Systems: A 12 point ROS discussed and pertinent positives are indicated in the HPI above.  All other systems are negative.  Physical Exam No direct physical exam was performed (except for noted visual exam findings with Video Visits).   Vital Signs: There were no vitals taken for this visit.  Imaging: CT ABDOMEN W WO CONTRAST  Result Date: 09/26/2020 CLINICAL DATA:  Left renal cell carcinoma diagnosed in 2018 with radiofrequency ablation in 2018. Asymptomatic. EXAM: CT ABDOMEN WITHOUT AND WITH CONTRAST TECHNIQUE: Multidetector CT imaging of the abdomen was performed following the standard protocol before and following the bolus administration of intravenous contrast. CONTRAST:  163mL OMNIPAQUE IOHEXOL 300 MG/ML  SOLN COMPARISON:  05/16/2019 FINDINGS: Lower chest: Motion degradation at the lung bases. Clear lung bases. Mild cardiomegaly. Median sternotomy without pericardial or pleural effusion. Tiny hiatal hernia. Hepatobiliary: Lobular hepatic capsule again identified with caudate and lateral segment left liver lobe enlargement. No focal liver lesion. Mild motion degradation continuing into the upper abdomen. Cholecystectomy, without biliary ductal dilatation. Pancreas: Normal, without mass or ductal dilatation. Spleen: Old granulomatous disease in the spleen. Adrenals/Urinary Tract: Normal adrenal glands. No renal calculi or hydronephrosis.  Posterior interpolar left renal ablation defect, including on 46/6. Measures on the order of 2.2 cm today and is similar to on the prior exam (when remeasured). No evidence of locally recurrent disease. Upper pole left renal too small to characterize lesion. A lower pole right renal lesion measures 9 mm on 64/11. This is similar in size to on the prior exam. Demonstrates complexity as evidenced by perceptible peripheral wall. This was similar in size on the 03/15/2017 MRI, where it demonstrated imaging characteristics most consistent with a cyst. No collecting system complication or hydronephrosis. Stomach/Bowel: Gastric body underdistention. Transverse duodenal diverticulum. Otherwise normal small bowel. Scattered colonic diverticula. Colonic stool burden suggests constipation. Vascular/Lymphatic: Advanced aortic and branch vessel atherosclerosis. Patent renal veins. No portal venous hypertension. No abdominal retroperitoneal adenopathy. Prominent porta hepatis nodes are similar, likely related to liver disease. Other: No ascites. Musculoskeletal: Prior median sternotomy. IMPRESSION: 1. Similar appearance of interpolar left renal ablation defect,  without recurrent or metastatic disease. 2. Cirrhosis. 3. A lower pole right renal lesion is similar in size, demonstrating mild complexity. Given size stability since and morphology on 03/15/2017 MRI, favored to represent a minimally complex cyst. Recommend attention on follow-up. 4.  Tiny hiatal hernia. 5.  Aortic Atherosclerosis (ICD10-I70.0). 6.  Possible constipation. Electronically Signed   By: Abigail Miyamoto M.D.   On: 09/26/2020 16:32    Labs:  CBC: No results for input(s): WBC, HGB, HCT, PLT in the last 8760 hours.  COAGS: No results for input(s): INR, APTT in the last 8760 hours.  BMP: Recent Labs    08/06/20 1313 09/26/20 1347  CREATININE 1.10* 0.90    LIVER FUNCTION TESTS: No results for input(s): BILITOT, AST, ALT, ALKPHOS, PROT, ALBUMIN in  the last 8760 hours.  TUMOR MARKERS: No results for input(s): AFPTM, CEA, CA199, CHROMGRNA in the last 8760 hours.  Assessment and Plan:  I spoke with Monique Sims over the phone.  A follow-up CT of the abdomen was performed on 09/26/2020 and demonstrates stable appearance of an ablation defect involving the medial interpolar left kidney without evidence of tumor recurrence.  No new renal lesions are identified.  The small complex cystic lesion of the lateral lower pole of the right kidney appears stable in size and is felt to represent a minimally complex cyst.  This shows no growth since 2018.  I recommended a follow-up CT scan in October, 2023, at which time Monique Sims will be 5 years post ablation of the left renal carcinoma.  Should that scan be stable, routine follow-up imaging can be discontinued.  Electronically Signed: Azzie Roup 10/02/2020, 11:25 AM   I spent a total of 10 Minutes in remote  clinical consultation, greater than 50% of which was counseling/coordinating care post cryoablation of a left renal carcinoma.    Visit type: Audio only (telephone). Audio (no video) only due to patient's lack of internet/smartphone capability. Alternative for in-person consultation at [facility], [place], [place], [place]. This visit type was conducted due to national recommendations for restrictions regarding the COVID-19 Pandemic (e.g. social distancing).  This format is felt to be most appropriate for this patient at this time.  All issues noted in this document were discussed and addressed.

## 2021-10-15 ENCOUNTER — Encounter: Payer: Self-pay | Admitting: Neurology

## 2021-10-15 ENCOUNTER — Ambulatory Visit: Payer: Medicare Other | Admitting: Neurology

## 2021-10-15 VITALS — BP 189/72 | HR 69 | Ht 62.5 in | Wt 163.0 lb

## 2021-10-15 DIAGNOSIS — G250 Essential tremor: Secondary | ICD-10-CM

## 2021-10-15 DIAGNOSIS — R269 Unspecified abnormalities of gait and mobility: Secondary | ICD-10-CM | POA: Insufficient documentation

## 2021-10-15 DIAGNOSIS — J0110 Acute frontal sinusitis, unspecified: Secondary | ICD-10-CM | POA: Insufficient documentation

## 2021-10-15 DIAGNOSIS — E1142 Type 2 diabetes mellitus with diabetic polyneuropathy: Secondary | ICD-10-CM

## 2021-10-15 DIAGNOSIS — I214 Non-ST elevation (NSTEMI) myocardial infarction: Secondary | ICD-10-CM | POA: Insufficient documentation

## 2021-10-15 MED ORDER — PROPRANOLOL HCL 20 MG PO TABS: 20.0000 mg | ORAL_TABLET | Freq: Two times a day (BID) | ORAL | 3 refills | Status: AC | PRN

## 2021-10-15 NOTE — Progress Notes (Signed)
? ?Chief Complaint  ?Patient presents with  ? New Patient (Initial Visit)  ?  Rm 15. Accompanied by son, Aaron Edelman. ?NP/Paper/[facility] [place]/Robert Unk Lightning MD/mixed action and resting tremor.  ? ? ? ? ?ASSESSMENT AND PLAN ? ?Monique Sims is a 76 y.o. female   ?Essential tremor ?Type II diabetic peripheral neuropathy ? A1c was 9.1, emphasized the importance of optimal control of diabetes, she has evidence of neuropathy, ? Normal TSH, may try Inderal as needed for tremor control, ? Only return to clinic for new issues ? ? ?DIAGNOSTIC DATA (LABS, IMAGING, TESTING) ?- I reviewed patient records, labs, notes, testing and imaging myself where available. ?Laboratory evaluations in January 2023 showed normal CMP creatinine of 1.1, A1c of 9.4, LDL of 40, normal TSH, total T48.1 B12 312, ? ?MEDICAL HISTORY: ? ?Monique Sims is a 76 year old female, accompanied by her son, seen in request by   her primary care physician Dr. Unk Lightning, Audree Camel, for evaluation of tremor, initial evaluation was on October 15, 2021 ? ?I reviewed and summarized the referring note. PMhx. ?Anxiety, xanax 0.'5mg'$ , prn ?HLD ?DM ?CAD, s/p CABG ?Left clear cell renal cell carcinoma ? ?She denies a previous history of tremor, there was no family history of tremor, around 2022, she began to notice tremor involving both hands, most noticeable when she use her hands, holding a cup of water, holding utensils, seems to gradually getting worse, but there was no limitation in her daily activity ? ?She denies loss sense of smell, denies REM sleep disorder, with frequent awakening due to nocturnal urination, she also complains of mild balance concerns ? ?She has a long history of diabetes, poorly controlled, A1c in January 2023 was 9.4, she does complains of bilateral feet numbness for many years ? ?PHYSICAL EXAM: ?  ?Vitals:  ? 10/15/21 1547  ?BP: (!) 189/72  ?Pulse: 69  ?Weight: 163 lb (73.9 kg)  ?Height: 5' 2.5" (1.588 m)  ? ?Not recorded ?  ? ? ?Body mass  index is 29.34 kg/m?. ? ?PHYSICAL EXAMNIATION: ? ?Gen: NAD, conversant, well nourised, well groomed                     ?Cardiovascular: Regular rate rhythm, no peripheral edema, warm, nontender. ?Eyes: Conjunctivae clear without exudates or hemorrhage ?Neck: Supple, no carotid bruits. ?Pulmonary: Clear to auscultation bilaterally  ? ?NEUROLOGICAL EXAM: ? ?MENTAL STATUS: ?Speech: ?   Speech is normal; fluent and spontaneous with normal comprehension.  ?Cognition: ?    Orientation to time, place and person ?    Normal recent and remote memory ?    Normal Attention span and concentration ?    Normal Language, naming, repeating,spontaneous speech ?    Fund of knowledge ?  ?CRANIAL NERVES: ?CN II: Visual fields are full to confrontation. Pupils are round equal and briskly reactive to light. ?CN III, IV, VI: extraocular movement are normal. No ptosis. ?CN V: Facial sensation is intact to light touch ?CN VII: Face is symmetric with normal eye closure  ?CN VIII: Hearing is normal to causal conversation. ?CN IX, X: Phonation is normal. ?CN XI: Head turning and shoulder shrug are intact ? ?MOTOR: Mild bilateral hands posturing tremor, no weakness, no rigidity bradykinesia, normal muscle strength, ? ?REFLEXES: ?Reflexes are 2+ and symmetric at the biceps, triceps, knees, and absent at ankles. Plantar responses are flexor. ? ?SENSORY: Length-dependent decreased to light touch, pinprick, vibratory sensation to ankle level ? ? ?COORDINATION: ?There is no trunk or  limb dysmetria noted. ? ?GAIT/STANCE: Need push-up to get up from seated position, cautious ? ?REVIEW OF SYSTEMS:  ?Full 14 system review of systems performed and notable only for as above ?All other review of systems were negative. ? ? ?ALLERGIES: ?Allergies  ?Allergen Reactions  ? Codeine Nausea Only  ? Dolobid [Diflunisal] Other (See Comments)  ?  Severe headache  ? Doxycycline Nausea And Vomiting  ? Levofloxacin Nausea And Vomiting  ? Zofran [Ondansetron Hcl] Nausea  Only  ? ? ?HOME MEDICATIONS: ?Current Outpatient Medications  ?Medication Sig Dispense Refill  ? acetaminophen (TYLENOL) 500 MG tablet Take 1,000 mg by mouth every 6 (six) hours as needed (for pain.).    ? albuterol (PROVENTIL HFA;VENTOLIN HFA) 108 (90 Base) MCG/ACT inhaler Inhale 1-2 puffs into the lungs every 6 (six) hours as needed for wheezing or shortness of breath.    ? ALPRAZolam (XANAX) 0.5 MG tablet Take 0.25-0.5 mg by mouth 3 (three) times daily as needed for anxiety.     ? aspirin EC 81 MG tablet Take 81 mg by mouth once a week.    ? ezetimibe (ZETIA) 10 MG tablet Take 10 mg by mouth daily.    ? famotidine (PEPCID) 20 MG tablet Take 20 mg by mouth 2 (two) times daily.    ? FLUoxetine (PROZAC) 20 MG capsule Take 20 mg by mouth daily.    ? metoprolol tartrate (LOPRESSOR) 25 MG tablet Take 25 mg by mouth 2 (two) times daily.    ? mirtazapine (REMERON) 15 MG tablet Take 15 mg by mouth at bedtime.    ? pravastatin (PRAVACHOL) 40 MG tablet Take 40 mg by mouth at bedtime.    ? promethazine (PHENERGAN) 12.5 MG tablet Take 12.5 mg by mouth every 6 (six) hours as needed for nausea or vomiting.    ? sitaGLIPtin-metformin (JANUMET) 50-1000 MG tablet Take 1 tablet by mouth 2 (two) times daily with a meal.    ? ?No current facility-administered medications for this visit.  ? ? ?PAST MEDICAL HISTORY: ?Past Medical History:  ?Diagnosis Date  ? Anxiety   ? Arthritis   ? mild  ? clear cell renal ca dx'd 03/2017  ? thermal ablation  ? Coronary artery disease   ? Diabetes mellitus without complication ([place])   ? type 2  ? Dyspnea   ? Headache   ? hx of migraines  ? Hypercholesterolemia   ? Hypertension   ? Left renal mass   ? Myocardial infarction [facility])   ? PONV (postoperative nausea and vomiting)   ? pt states during her heart surgery was very mean when she awaken from anesthesia   ? ? ?PAST SURGICAL HISTORY: ?Past Surgical History:  ?Procedure Laterality Date  ? APPENDECTOMY    ? CARDIAC CATHETERIZATION    ? CARPAL TUNNEL  RELEASE    ? CHOLECYSTECTOMY    ? CORONARY ARTERY BYPASS GRAFT  05/2016  ? EYE SURGERY    ? bil cataracts  ? IR RADIOLOGIST EVAL & MGMT  03/09/2017  ? IR RADIOLOGIST EVAL & MGMT  07/07/2017  ? IR RADIOLOGIST EVAL & MGMT  09/22/2017  ? IR RADIOLOGIST EVAL & MGMT  05/24/2018  ? IR RADIOLOGIST EVAL & MGMT  05/22/2019  ? IR RADIOLOGIST EVAL & MGMT  10/02/2020  ? RADIOLOGY WITH ANESTHESIA Left 06/03/2017  ? Procedure: RADIOLOGY WITH ANESTHESIA-RENAL CRYOABLATION;  Surgeon: Aletta Edouard, MD;  Location: [facility];  Service: Radiology;  Laterality: Left;  ? RENAL CRYOABLATION    ? ? ?  FAMILY HISTORY: ?History reviewed. No pertinent family history. ? ?SOCIAL HISTORY: ?Social History  ? ?Socioeconomic History  ? Marital status: Widowed  ?  Spouse name: Not on file  ? Number of children: Not on file  ? Years of education: Not on file  ? Highest education level: Not on file  ?Occupational History  ? Not on file  ?Tobacco Use  ? Smoking status: Every Day  ?  Packs/day: 0.50  ?  Years: 50.00  ?  Pack years: 25.00  ?  Types: Cigarettes  ? Smokeless tobacco: Never  ?Vaping Use  ? Vaping Use: Never used  ?Substance and Sexual Activity  ? Alcohol use: No  ? Drug use: No  ? Sexual activity: Not Currently  ?Other Topics Concern  ? Not on file  ?Social History Narrative  ? Not on file  ? ?Social Determinants of Health  ? ?Financial Resource Strain: Not on file  ?Food Insecurity: Not on file  ?Transportation Needs: Not on file  ?Physical Activity: Not on file  ?Stress: Not on file  ?Social Connections: Not on file  ?Intimate Partner Violence: Not on file  ? ? ? ? ?Marcial Pacas, M.D. Ph.D. ? ?[facility] ?[address], [address] ?[place], [place] [address] ?Ph: [phone] ?Fax: [phone] ? ?CC:  Myrlene Broker, MD ?[facility] ?[address],  [place] [address]  Myrlene Broker, MD  - ?

## 2021-10-18 DIAGNOSIS — E1142 Type 2 diabetes mellitus with diabetic polyneuropathy: Secondary | ICD-10-CM | POA: Insufficient documentation

## 2022-04-22 ENCOUNTER — Other Ambulatory Visit: Payer: Self-pay | Admitting: Interventional Radiology

## 2022-04-22 DIAGNOSIS — C642 Malignant neoplasm of left kidney, except renal pelvis: Secondary | ICD-10-CM

## 2022-05-24 ENCOUNTER — Ambulatory Visit (HOSPITAL_COMMUNITY)
Admission: RE | Admit: 2022-05-24 | Discharge: 2022-05-24 | Disposition: A | Discharge status: Home / Self Care | Payer: Medicare Other | Source: Ambulatory Visit | Attending: Interventional Radiology | Admitting: Interventional Radiology

## 2022-05-24 DIAGNOSIS — C642 Malignant neoplasm of left kidney, except renal pelvis: Secondary | ICD-10-CM | POA: Diagnosis present

## 2022-05-24 LAB — POCT I-STAT CREATININE: Creatinine, Ser: 1.3 mg/dL — ABNORMAL HIGH (ref 0.44–1.00)

## 2022-05-24 MED ORDER — IOHEXOL 300 MG/ML  SOLN
100.0000 mL | Freq: Once | INTRAMUSCULAR | Status: AC | PRN
Start: 2022-05-24 — End: 2022-05-24
  Administered 2022-05-24: 100 mL via INTRAVENOUS

## 2022-05-28 ENCOUNTER — Encounter: Payer: Self-pay | Admitting: *Deleted

## 2022-05-28 ENCOUNTER — Ambulatory Visit
Admission: RE | Admit: 2022-05-28 | Discharge: 2022-05-28 | Disposition: A | Discharge status: Home / Self Care | Payer: Medicare Other | Source: Ambulatory Visit | Attending: Interventional Radiology | Admitting: Interventional Radiology

## 2022-05-28 DIAGNOSIS — C642 Malignant neoplasm of left kidney, except renal pelvis: Secondary | ICD-10-CM

## 2022-05-28 HISTORY — PX: IR RADIOLOGIST EVAL & MGMT: IMG5224

## 2022-05-28 NOTE — Progress Notes (Signed)
Chief Complaint: Patient was consulted remotely today (TeleHealth) for follow-up after cryoablation of a left renal carcinoma.  History of Present Illness: Monique Sims is a 76 y.o. female status post cryoablation of a biopsy-proven clear cell left renal carcinoma on 06/03/2017.  Mrs. Cunning has been doing well since prior imaging. She denies any urinary symptoms, abdominal pain or flank pain.  Past Medical History:  Diagnosis Date   Anxiety    Arthritis    mild   clear cell renal ca dx'd 03/2017   thermal ablation   Coronary artery disease    Diabetes mellitus without complication ([place])    type 2   Dyspnea    Headache    hx of migraines   Hypercholesterolemia    Hypertension    Left renal mass    Myocardial infarction ([place])    PONV (postoperative nausea and vomiting)    pt states during her heart surgery was very mean when she awaken from anesthesia     Past Surgical History:  Procedure Laterality Date   [place] GRAFT  05/2016   EYE SURGERY     bil cataracts   IR RADIOLOGIST EVAL & MGMT  03/09/2017   IR RADIOLOGIST EVAL & MGMT  07/07/2017   IR RADIOLOGIST EVAL & MGMT  09/22/2017   IR RADIOLOGIST EVAL & MGMT  05/24/2018   IR RADIOLOGIST EVAL & MGMT  05/22/2019   IR RADIOLOGIST EVAL & MGMT  10/02/2020   RADIOLOGY WITH ANESTHESIA Left 06/03/2017   Procedure: RADIOLOGY WITH ANESTHESIA-RENAL CRYOABLATION;  Surgeon: Aletta Edouard, MD;  Location: [facility];  Service: Radiology;  Laterality: Left;   RENAL CRYOABLATION      Allergies: Codeine, Dolobid [diflunisal], Doxycycline, Gabapentin, Levofloxacin, and Zofran [ondansetron hcl]  Medications: Prior to Admission medications   Medication Sig Start Date End Date Taking? Authorizing Provider  acetaminophen (TYLENOL) 500 MG tablet Take 1,000 mg by mouth every 6 (six) hours as needed (for pain.).    [provider]  albuterol (PROVENTIL HFA;VENTOLIN HFA) 108 (90 Base) MCG/ACT inhaler Inhale 1-2 puffs into the lungs every 6 (six) hours as needed for wheezing or shortness of breath.    [provider]  ALPRAZolam Duanne Moron) 0.5 MG tablet Take 0.25-0.5 mg by mouth 3 (three) times daily as needed for anxiety.     [provider]  aspirin EC 81 MG tablet Take 81 mg by mouth once a week.    [provider]  ezetimibe (ZETIA) 10 MG tablet Take 10 mg by mouth daily. 02/02/17   [provider]  famotidine (PEPCID) 20 MG tablet Take 20 mg by mouth 2 (two) times daily. 02/12/17   [provider]  FLUoxetine (PROZAC) 20 MG capsule Take 20 mg by mouth daily. 02/26/17   [provider]  metoprolol tartrate (LOPRESSOR) 25 MG tablet Take 25 mg by mouth 2 (two) times daily. 01/21/17   [provider]  mirtazapine (REMERON) 15 MG tablet Take 15 mg by mouth at bedtime. 02/02/17   [provider]  pravastatin (PRAVACHOL) 40 MG tablet Take 40 mg by mouth at bedtime. 02/26/17   [provider]  promethazine (PHENERGAN) 12.5 MG tablet Take 12.5 mg by mouth every 6 (six) hours as needed for nausea or vomiting.    [provider]  propranolol (INDERAL) 20 MG tablet Take 1  tablet (20 mg total) by mouth 2 (two) times daily as needed. 10/15/21   Marcial Pacas, MD  sitaGLIPtin-metformin (JANUMET) 50-1000 MG tablet Take 1 tablet by mouth 2 (two) times daily with a meal.    [provider]     No family history on file.  Social History   Socioeconomic History   Marital status: Widowed    Spouse name: Not on file   Number of children: Not on file   Years of education: Not on file   Highest education level: Not on file  Occupational History   Not on file  Tobacco Use   Smoking status: Every Day    Packs/day: 0.50    Years: 50.00    Total pack years: 25.00    Types: Cigarettes   Smokeless tobacco: Never  Vaping Use   Vaping Use:  Never used  Substance and Sexual Activity   Alcohol use: No   Drug use: No   Sexual activity: Not Currently  Other Topics Concern   Not on file  Social History Narrative   Not on file   Social Determinants of Health   Financial Resource Strain: Not on file  Food Insecurity: Not on file  Transportation Needs: Not on file  Physical Activity: Not on file  Stress: Not on file  Social Connections: Not on file    ECOG Status: 0 - Asymptomatic  Review of Systems  Constitutional: Negative.   Respiratory: Negative.    Cardiovascular: Negative.   Gastrointestinal: Negative.   Genitourinary: Negative.   Musculoskeletal: Negative.   Neurological: Negative.     Review of Systems: A 12 point ROS discussed and pertinent positives are indicated in the HPI above.  All other systems are negative.   Physical Exam No direct physical exam was performed (except for noted visual exam findings with Video Visits).   Vital Signs: There were no vitals taken for this visit.  Imaging: CT ABDOMEN W WO CONTRAST  Result Date: 05/25/2022 CLINICAL DATA:  History of left renal cell carcinoma status post radiofrequency ablation, follow-up. * Tracking Code: BO * EXAM: CT ABDOMEN WITHOUT AND WITH CONTRAST TECHNIQUE: Multidetector CT imaging of the abdomen was performed following the standard protocol before and following the bolus administration of intravenous contrast. RADIATION DOSE REDUCTION: This exam was performed according to the departmental dose-optimization program which includes automated exposure control, adjustment of the mA and/or kV according to patient size and/or use of iterative reconstruction technique. CONTRAST:  166m OMNIPAQUE IOHEXOL 300 MG/ML  SOLN COMPARISON:  Multiple priors including most recent CT September 26, 2020 FINDINGS: Lower chest: No acute abnormality. Calcifications of the mitral annulus. Prior median sternotomy. Symmetric distal esophageal wall thickening. Hepatobiliary:  Enlargement of the caudate and lateral left lobe of the liver with hepatic contour nodularity suggestive of cirrhosis. Gallbladder is surgically absent. No biliary ductal dilation. Pancreas: No pancreatic ductal dilation or evidence of acute inflammation. Spleen: No splenomegaly or focal splenic lesion Adrenals/Urinary Tract: Bilateral adrenal glands are within normal limits. No hydronephrosis. Decreased size of the cryoablation defect along the posterior interpolar left kidney now measuring 2.6 x 1.8 cm on image 58/3 previously 2.9 x 1.9 cm when remeasured for consistency. Complex right lower pole renal lesion measures 11 mm, unchanged when remeasured for consistency and previously characterized as a benign renal cyst on MRI 03/15/2017. Tiny hypodense 5 mm right interpolar renal lesion is technically too small to accurately characterize but statistically likely reflect a cyst and considered benign requiring no independent imaging  follow-up. No solid enhancing renal mass. Kidneys demonstrate symmetric enhancement and excretion of contrast material. Stomach/Bowel: Stomach is mildly distended with ingested material and gas. No pathologic dilation or evidence of acute inflammation involving loops of large or small bowel in the abdomen. Left-sided colonic diverticulosis without findings of acute diverticulitis Vascular/Lymphatic: Aortic atherosclerosis. Prominent porta hepatis nodes are similar prior measuring up to 13 mm in short axis on image 52/16. Other: No significant abdominal free fluid. Nodular stranding in the anterior abdominal wall for instance on image 86/16 is unchanged from prior and may reflect sequela of subcutaneous injections. Musculoskeletal: No aggressive lytic or blastic lesion of bone. IMPRESSION: 1. Slight decreased size of the left interpolar cryoablation defect without evidence of local recurrence. 2. No evidence of metastatic disease in the abdomen. 3. Unchanged size of the minimally complex 11  mm cystic right interpolar renal lesion, stable in size and morphology dating back to at least MRI 03/15/2017. Imaging features on prior MRI and 5 years of stability are indicative of a benign finding. However, given the complexity suggest continued attention on follow-up screening studies. 4. Morphologic changes suggestive of cirrhosis. 5. Left-sided colonic diverticulosis without findings of acute diverticulitis. 6. Symmetric distal esophageal wall thickening, which may reflect esophagitis. 7.  Aortic Atherosclerosis (ICD10-I70.0). Electronically Signed   By: Dahlia Bailiff M.D.   On: 05/25/2022 14:43    Labs:  CBC: No results for input(s): "WBC", "HGB", "HCT", "PLT" in the last 8760 hours.  COAGS: No results for input(s): "INR", "APTT" in the last 8760 hours.  BMP: Recent Labs    05/24/22 1401  CREATININE 1.30*    Assessment and Plan:  I spoke with Mrs. Astorino over the phone.  A follow-up CT of the abdomen was performed on 05/14/2022 and demonstrates stable retraction of an ablation defect involving the medial interpolar left kidney without evidence of tumor recurrence.  No new renal lesions are identified.  The small complex cystic lesion of the lateral lower pole of the right kidney appears stable in size and is felt to represent a minimally complex cyst.  This shows no growth since 2018 and is therefore considered benign.  It has now been 5 years since original ablation on 06/03/2017.  No further routine follow-up imaging of the abdomen is necessary.   Electronically Signed: Azzie Roup 05/28/2022, 2:36 PM    I spent a total of 10 Minutes in remote  clinical consultation, greater than 50% of which was counseling/coordinating care post cryoablation of a left renal carcinoma.    Visit type: Audio only (telephone). Audio (no video) only due to patient's lack of internet/smartphone capability. Alternative for in-person consultation at [facility] [place], [address],  [place], [place]. This visit type was conducted due to national recommendations for restrictions regarding the COVID-19 Pandemic (e.g. social distancing).  This format is felt to be most appropriate for this patient at this time.  All issues noted in this document were discussed and addressed.
